# Patient Record
Sex: Male | Born: 1945 | Race: White | Hispanic: No | Marital: Married | Smoking: Never smoker
Health system: Southern US, Community
[De-identification: ages and names within clinical notes are randomized; demographics above are authoritative.]

## PROBLEM LIST (undated history)

## (undated) DIAGNOSIS — I219 Acute myocardial infarction, unspecified: Secondary | ICD-10-CM

## (undated) DIAGNOSIS — E119 Type 2 diabetes mellitus without complications: Secondary | ICD-10-CM

## (undated) DIAGNOSIS — I639 Cerebral infarction, unspecified: Secondary | ICD-10-CM

## (undated) DIAGNOSIS — I2510 Atherosclerotic heart disease of native coronary artery without angina pectoris: Secondary | ICD-10-CM

## (undated) DIAGNOSIS — I10 Essential (primary) hypertension: Secondary | ICD-10-CM

## (undated) DIAGNOSIS — G20 Parkinson's disease: Secondary | ICD-10-CM

## (undated) DIAGNOSIS — I82409 Acute embolism and thrombosis of unspecified deep veins of unspecified lower extremity: Secondary | ICD-10-CM

## (undated) HISTORY — PX: CORONARY ANGIOPLASTY WITH STENT PLACEMENT: SHX49

---

## 2004-04-28 ENCOUNTER — Emergency Department (HOSPITAL_COMMUNITY): Admission: EM | Admit: 2004-04-28 | Discharge: 2004-04-28 | Payer: Self-pay | Admitting: Emergency Medicine

## 2007-04-23 HISTORY — PX: FINGER SURGERY: SHX640

## 2007-11-21 ENCOUNTER — Emergency Department (HOSPITAL_BASED_OUTPATIENT_CLINIC_OR_DEPARTMENT_OTHER): Admission: EM | Admit: 2007-11-21 | Discharge: 2007-11-21 | Payer: Self-pay | Admitting: Emergency Medicine

## 2008-03-24 ENCOUNTER — Ambulatory Visit: Payer: Self-pay | Admitting: Diagnostic Radiology

## 2008-03-24 ENCOUNTER — Emergency Department (HOSPITAL_BASED_OUTPATIENT_CLINIC_OR_DEPARTMENT_OTHER): Admission: EM | Admit: 2008-03-24 | Discharge: 2008-03-24 | Payer: Self-pay | Admitting: Emergency Medicine

## 2009-04-22 HISTORY — PX: ULNAR NERVE TRANSPOSITION: SHX2595

## 2009-10-01 ENCOUNTER — Ambulatory Visit: Payer: Self-pay | Admitting: Diagnostic Radiology

## 2009-10-01 ENCOUNTER — Emergency Department (HOSPITAL_BASED_OUTPATIENT_CLINIC_OR_DEPARTMENT_OTHER): Admission: EM | Admit: 2009-10-01 | Discharge: 2009-10-01 | Payer: Self-pay | Admitting: Emergency Medicine

## 2010-09-07 NOTE — Consult Note (Signed)
NAMEBRADLY, [name] NO.:  [id number]   MEDICAL RECORD NO.:  [id number]          PATIENT TYPE:  EMS   LOCATION:  ED                           FACILITY:  [facility]   PHYSICIAN:  Erasmo Leventhal, M.D.DATE OF BIRTH:  [birth year]   DATE OF CONSULTATION:  04/28/2004  DATE OF DISCHARGE:                                   CONSULTATION   ORTHOPEDICS CONSULTATION/EMERGENCY ROOM VISIT:   HISTORY OF PRESENT ILLNESS:  Mr. [name] is a 65 year old male patient of  Dr. Augusto Gamble.  Patient underwent a left elbow ulnar nerve transposition  and removal of olecranon bursa approximately 4 weeks ago.  He has done fine  however, over the past 36-48 hours he has developed increasing pain and  swelling in the left elbow region, he called our office Friday during the  day and again last night.  Tonight he called me and said his left elbow was  warm, felt hot, he was having increasing pain, I asked him to come to the  emergency room to be evaluated.  He denied any known fever, chills or sweats  or drainage.  He had no constitutional symptoms.   PHYSICAL EXAMINATION:  He is awake and alert, he is very cooperative, very  pleasant gentleman.  He is in no acute distress.  Afebrile.  Left elbow is  examined, the elbow joint itself no palpable effusion, range of motion and  flexion 140, extension full, supination and pronation full, his elbow wound  is well healed, he does have a large area of fluid collection near the  surgical site.   I discussed with the patient and told him we best aspirate this for  diagnosis and treatment.  He agreed.   PROCEDURE:  Betadine prep, aspirated the area, removed approximately 50 mL  of a serous colored fluid, no evidence of pus or purulence.  This is well  decompressed and he felt much better after this.  Fluid was sent for Gram  stain and culture.   IMPRESSION:  Left elbow postoperative seroma.   RECOMMENDATIONS:  At this time we went ahead and  sent a Gram stain and  culture.  I would like to prophylactically go ahead and put him on  antibiotics.  He has no allergies.  I put him on Keflex 500 q.i.d.  He will  go home, immobilize the elbow, elevate, and I also placed him on Lodine 400  b.i.d. p.c.  If he has any increasing problems over the weekend he is going  to give [place] a call, if not he has therapy scheduled at Dr. Carlos Levering office on  Monday afternoon.  He was already told to come by the office on Friday if he  was not feeling better to come see Dr. Amanda Pea on Monday, he knows to show  the elbow to the therapist on Monday afternoon and if they think there is a  concern they will call the service of Dr. Amanda Pea.  He understands if he has  any worsening of condition tonight to give me a call.  All questions  encouraged  and answered with Mr. Katzman in great detail.  He is a very  pleasant gentleman and a pleasure to see this evening.      RAC/MEDQ  D:  04/28/2004  T:  04/28/2004  Job:  [id number]   cc:   Earlie Server. Everlene Other, M.D.  [address]  [place]  [place] [address]  Fax: [phone]

## 2011-01-25 LAB — DIFFERENTIAL
Basophils Absolute: 0 10*3/uL (ref 0.0–0.1)
Basophils Relative: 0 % (ref 0–1)
Eosinophils Absolute: 0.3 10*3/uL (ref 0.0–0.7)
Lymphocytes Relative: 19 % (ref 12–46)

## 2011-01-25 LAB — CBC
Hemoglobin: 15.7 g/dL (ref 13.0–17.0)
MCV: 91.1 fL (ref 78.0–100.0)
Platelets: 163 10*3/uL (ref 150–400)
RBC: 5.06 MIL/uL (ref 4.22–5.81)
RDW: 12.9 % (ref 11.5–15.5)
WBC: 9.3 10*3/uL (ref 4.0–10.5)

## 2011-01-25 LAB — COMPREHENSIVE METABOLIC PANEL
ALT: 25 U/L (ref 0–53)
Alkaline Phosphatase: 87 U/L (ref 39–117)
Calcium: 9.3 mg/dL (ref 8.4–10.5)
Chloride: 102 mEq/L (ref 96–112)
GFR calc Af Amer: >60 mL/min (ref >60)
GFR calc non Af Amer: >60 mL/min (ref >60)
Potassium: 3.6 mEq/L (ref 3.5–5.1)
Total Protein: 6.9 g/dL (ref 6.0–8.3)

## 2011-01-25 LAB — POCT CARDIAC MARKERS
CKMB, poc: 3.6 ng/mL (ref 1.0–8.0)
Myoglobin, poc: 116 ng/mL (ref 12–200)

## 2011-01-25 LAB — PROTIME-INR
INR: 1 (ref 0.00–1.49)
Prothrombin Time: 13.7 seconds (ref 11.6–15.2)

## 2011-04-23 HISTORY — PX: CORONARY ANGIOPLASTY WITH STENT PLACEMENT: SHX49

## 2011-11-22 ENCOUNTER — Emergency Department (HOSPITAL_BASED_OUTPATIENT_CLINIC_OR_DEPARTMENT_OTHER): Payer: Managed Care, Other (non HMO)

## 2011-11-22 ENCOUNTER — Emergency Department (HOSPITAL_BASED_OUTPATIENT_CLINIC_OR_DEPARTMENT_OTHER)
Admission: EM | Admit: 2011-11-22 | Discharge: 2011-11-22 | Disposition: A | Discharge status: Home / Self Care | Payer: Managed Care, Other (non HMO) | Attending: Emergency Medicine | Admitting: Emergency Medicine

## 2011-11-22 ENCOUNTER — Encounter (HOSPITAL_BASED_OUTPATIENT_CLINIC_OR_DEPARTMENT_OTHER): Payer: Self-pay | Admitting: Emergency Medicine

## 2011-11-22 DIAGNOSIS — E119 Type 2 diabetes mellitus without complications: Secondary | ICD-10-CM | POA: Insufficient documentation

## 2011-11-22 DIAGNOSIS — Z8673 Personal history of transient ischemic attack (TIA), and cerebral infarction without residual deficits: Secondary | ICD-10-CM | POA: Insufficient documentation

## 2011-11-22 DIAGNOSIS — M542 Cervicalgia: Secondary | ICD-10-CM

## 2011-11-22 DIAGNOSIS — I2510 Atherosclerotic heart disease of native coronary artery without angina pectoris: Secondary | ICD-10-CM | POA: Insufficient documentation

## 2011-11-22 DIAGNOSIS — M62838 Other muscle spasm: Secondary | ICD-10-CM

## 2011-11-22 DIAGNOSIS — I10 Essential (primary) hypertension: Secondary | ICD-10-CM | POA: Insufficient documentation

## 2011-11-22 HISTORY — DX: Atherosclerotic heart disease of native coronary artery without angina pectoris: I25.10

## 2011-11-22 HISTORY — DX: Essential (primary) hypertension: I10

## 2011-11-22 HISTORY — DX: Cerebral infarction, unspecified: I63.9

## 2011-11-22 MED ORDER — OXYCODONE-ACETAMINOPHEN 5-325 MG PO TABS: ORAL_TABLET | ORAL | Status: DC

## 2011-11-22 MED ORDER — DIAZEPAM 5 MG PO TABS: ORAL_TABLET | ORAL | Status: DC

## 2011-11-22 NOTE — ED Provider Notes (Signed)
History     CSN: [id number]  Arrival date & time 11/22/11  0239   First MD Initiated Contact with Patient 11/22/11 0256      Chief Complaint  Patient presents with  . Neck Injury    (Consider location/radiation/quality/duration/timing/severity/associated sxs/prior treatment) HPI Patient is a 66 year old male who presents today for neck pain he rates as a 10 out of 10 and sharp. Patient developed significant discomfort 3 days ago upon waking. Patient feels he has difficulty with turning his neck to the right. He has tried a dose of his wife's muscle relaxer at home. He did not know the name of this but knows that it is not Valium. He's also tried hydrocodone left from a previous surgery without relief. Patient played golf a few days ago but cannot think of a specific injury. He does know that he's had increasing pain in his neck over time and wondered if he could have bone spurs in his neck. He has no history of osteoporosis or conditions predisposing to pathologic fractures such as increased steroid use or malignancy. Patient denies any numbness, tingling, weakness, or incontinence. There are no other associated or modifying factors. Pain is worse with palpation along the right trapezius as well as movement to the right. Past Medical History  Diagnosis Date  . Hypertension   . Diabetes mellitus   . Coronary artery disease   . Stroke     Past Surgical History  Procedure Date  . Stents to heart     History reviewed. No pertinent family history.  History  Substance Use Topics  . Smoking status: Never Smoker   . Smokeless tobacco: Not on file  . Alcohol Use: 2.0 oz/week    4 drink(s) per week      Review of Systems  Constitutional: Negative.   HENT: Positive for neck pain and neck stiffness.   Eyes: Negative.   Respiratory: Negative.   Cardiovascular: Negative.   Gastrointestinal: Negative.   Genitourinary: Negative.   Skin: Negative.   Neurological: Negative.     Hematological: Negative.   Psychiatric/Behavioral: Negative.   All other systems reviewed and are negative.    Allergies  Lisinopril  Home Medications   Current Outpatient Rx  Name Route Sig Dispense Refill  . ASPIRIN 81 MG PO TABS Oral Take 81 mg by mouth daily.    Marland Kitchen HYDROCODONE-ACETAMINOPHEN 5-500 MG PO TABS Oral Take 1 tablet by mouth every 6 (six) hours as needed.    Marland Kitchen LOSARTAN POTASSIUM 50 MG PO TABS Oral Take 50 mg by mouth daily.    Marland Kitchen METHOCARBAMOL 500 MG PO TABS Oral Take 500 mg by mouth 4 (four) times daily.    Marland Kitchen OMEPRAZOLE 10 MG PO CPDR Oral Take 10 mg by mouth daily.    Marland Kitchen ROSUVASTATIN CALCIUM 10 MG PO TABS Oral Take 10 mg by mouth daily.    . WARFARIN SODIUM 5 MG PO TABS Oral Take 5 mg by mouth daily.    Marland Kitchen DIAZEPAM 5 MG PO TABS  Take 1 tab by mouth when necessary muscle spasm. You may repeat x1 at 30 minutes if no improvement. Do not take more than 2 pills every 8 hours. 5 tablet 0  . OXYCODONE-ACETAMINOPHEN 5-325 MG PO TABS  Take 1-2 pills by mouth every 6 hours when necessary pain. 15 tablet 0    BP 121/92  Pulse 88  Temp 97.8 F (36.6 C) (Oral)  Resp 18  SpO2 97%  Physical Exam  Nursing note and  vitals reviewed. GEN: Well-developed, well-nourished male in no distress HEENT: Atraumatic, normocephalic. No midline tenderness to palpation. Patient with point tenderness in the right trapezius. Limited range of motion with rotating of the head to the right secondary to pain. EYES: PERRLA BL, no scleral icterus. NECK: Trachea midline, no meningismus CV: regular rate and rhythm. No murmurs, rubs, or gallops PULM: No respiratory distress.  No crackles, wheezes, or rales. GI: soft, non-tender. No guarding, rebound, or tenderness. + bowel sounds  GU: deferred Neuro: cranial nerves grossly 2-12 intact, no abnormalities of strength or sensation, A and O x 3 MSK: Patient moves all 4 extremities symmetrically, no deformity, edema, or injury noted Skin: No rashes petechiae,  purpura, or jaundice Psych: no abnormality of mood   ED Course  Procedures (including critical care time)  Labs Reviewed - No data to display Dg Cervical Spine Complete  11/22/2011  *RADIOLOGY REPORT*  Clinical Data: Severe neck pain  CERVICAL SPINE - COMPLETE 4+ VIEW  Comparison: None.  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  Minimal anterolisthesis of C2 on C3 and mild anterolisthesis of C7 on T1.  No acute fracture or dislocation identified.  Lung apices are clear.  Maintained C1-2 articulation. No dens fracture.  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  Minimal anterolisthesis of C2 on C3 and mild anterolisthesis of C7 on T1.  No acute osseous finding.  Original Report Authenticated By: Waneta Martins, M.D.     1. Neck pain on right side   2. Muscle spasms of neck       MDM  Based on my evaluation patient had plain film of the C-spine. This did show degenerative changes but no fractures or signs of pathology predisposing to this. Patient did not have a ride home this evening. We discussed that I felt that his acute 3 days this pain was likely secondary to torticollis. He was given a prescription for Valium 5 mg tabs. Patient was instructed to take one dose and wait for improvement. If he felt no effects at 30 minutes patient was to take a second dose. Patient also was provided a prescription for several tabs of Percocet to take if he continued to have pain. He was advised to take these instead of Vicodin. Patient was notified of fall risk with these medications. He was also told that given his x-ray findings it did appear that he might have some ongoing problem he also is advised to followup with his primary care provider. Patient was discharged in good condition.s with neck pain in the future. He was given the name of the neurosurgeon on call to followup as needed.        Cyndra Numbers, MD 11/22/11 [phone]

## 2011-11-22 NOTE — ED Notes (Signed)
Pt reports neck pain to right posterior neck x 3 days

## 2011-11-22 NOTE — ED Notes (Signed)
Pt reports severe throbbing like pain to right posterior neck x 3 days, nothing makes it better, originally he thought maybe he slept wrong, but pain has continued. Pt self medicated at home with hydrocodone and robaxin without relief, denies numbness and parasthesia, decreased rom, no radiation

## 2012-09-20 ENCOUNTER — Encounter (HOSPITAL_BASED_OUTPATIENT_CLINIC_OR_DEPARTMENT_OTHER): Payer: Self-pay

## 2012-09-20 ENCOUNTER — Emergency Department (HOSPITAL_BASED_OUTPATIENT_CLINIC_OR_DEPARTMENT_OTHER)
Admission: EM | Admit: 2012-09-20 | Discharge: 2012-09-20 | Disposition: A | Discharge status: Home / Self Care | Payer: Medicare Other | Attending: Emergency Medicine | Admitting: Emergency Medicine

## 2012-09-20 DIAGNOSIS — Z8673 Personal history of transient ischemic attack (TIA), and cerebral infarction without residual deficits: Secondary | ICD-10-CM | POA: Insufficient documentation

## 2012-09-20 DIAGNOSIS — I10 Essential (primary) hypertension: Secondary | ICD-10-CM | POA: Insufficient documentation

## 2012-09-20 DIAGNOSIS — Z7982 Long term (current) use of aspirin: Secondary | ICD-10-CM | POA: Insufficient documentation

## 2012-09-20 DIAGNOSIS — I2510 Atherosclerotic heart disease of native coronary artery without angina pectoris: Secondary | ICD-10-CM | POA: Insufficient documentation

## 2012-09-20 DIAGNOSIS — S61002A Unspecified open wound of left thumb without damage to nail, initial encounter: Secondary | ICD-10-CM

## 2012-09-20 DIAGNOSIS — Z79899 Other long term (current) drug therapy: Secondary | ICD-10-CM | POA: Insufficient documentation

## 2012-09-20 DIAGNOSIS — Y9389 Activity, other specified: Secondary | ICD-10-CM | POA: Insufficient documentation

## 2012-09-20 DIAGNOSIS — Z9861 Coronary angioplasty status: Secondary | ICD-10-CM | POA: Insufficient documentation

## 2012-09-20 DIAGNOSIS — E119 Type 2 diabetes mellitus without complications: Secondary | ICD-10-CM | POA: Insufficient documentation

## 2012-09-20 DIAGNOSIS — S61209A Unspecified open wound of unspecified finger without damage to nail, initial encounter: Secondary | ICD-10-CM | POA: Insufficient documentation

## 2012-09-20 DIAGNOSIS — Z7901 Long term (current) use of anticoagulants: Secondary | ICD-10-CM | POA: Insufficient documentation

## 2012-09-20 DIAGNOSIS — W278XXA Contact with other nonpowered hand tool, initial encounter: Secondary | ICD-10-CM | POA: Insufficient documentation

## 2012-09-20 DIAGNOSIS — Y929 Unspecified place or not applicable: Secondary | ICD-10-CM | POA: Insufficient documentation

## 2012-09-20 NOTE — ED Notes (Signed)
Pt states that he has laceration to the L thumb from a table saw.  Pt states that he is on coumadin, bleeding will not stop.  Pt has bleeding under control at present with gauze pressure dressing.  Last Tetanus <5 yr ago.

## 2012-09-20 NOTE — ED Provider Notes (Signed)
History     CSN: [id number]  Arrival date & time 09/20/12  1033   First MD Initiated Contact with Patient 09/20/12 1059      Chief Complaint  Patient presents with  . Laceration    (Consider location/radiation/quality/duration/timing/severity/associated sxs/prior treatment) HPI Comments: States that when he takes off the bandage it keeps bleeding  Patient is a 67 y.o. male presenting with skin laceration. The history is provided by the patient.  Laceration Location:  Finger Finger laceration location:  L thumb Depth:  Cutaneous Quality: avulsion   Bleeding comment:  States has been bleeding since yesterday Time since incident:  1 day Injury mechanism: cut on a table saw. Pain details:    Quality:  Aching   Severity:  Mild   Timing:  Constant   Progression:  Unchanged Foreign body present:  No foreign bodies Relieved by:  Pressure Tetanus status:  Up to date   Past Medical History  Diagnosis Date  . Hypertension   . Diabetes mellitus   . Coronary artery disease   . Stroke     Past Surgical History  Procedure Laterality Date  . Stents to heart      History reviewed. No pertinent family history.  History  Substance Use Topics  . Smoking status: Never Smoker   . Smokeless tobacco: Never Used  . Alcohol Use: 2.0 oz/week    4 drink(s) per week      Review of Systems  All other systems reviewed and are negative.    Allergies  Lisinopril  Home Medications   Current Outpatient Rx  Name  Route  Sig  Dispense  Refill  . acetaminophen (TYLENOL) 500 MG tablet   Oral   Take 1,000 mg by mouth every 6 (six) hours as needed for pain.         Marland Kitchen aspirin 81 MG tablet   Oral   Take 81 mg by mouth daily.         Marland Kitchen HYDROcodone-acetaminophen (VICODIN) 5-500 MG per tablet   Oral   Take 1 tablet by mouth every 6 (six) hours as needed.         Marland Kitchen ibuprofen (ADVIL,MOTRIN) 200 MG tablet   Oral   Take 800 mg by mouth every 6 (six) hours as needed for  pain.         Marland Kitchen omeprazole (PRILOSEC) 20 MG capsule   Oral   Take 40 mg by mouth daily.         Marland Kitchen oxyCODONE-acetaminophen (PERCOCET/ROXICET) 5-325 MG per tablet      Take 1-2 pills by mouth every 6 hours when necessary pain.   15 tablet   0   . rosuvastatin (CRESTOR) 10 MG tablet   Oral   Take 10 mg by mouth daily.         . valsartan (DIOVAN) 40 MG tablet   Oral   Take 40 mg by mouth daily.         Marland Kitchen warfarin (COUMADIN) 5 MG tablet   Oral   Take 5 mg by mouth daily. Take 5 mg every day except on Monday and Friday take 7.5mg .         . diazepam (VALIUM) 5 MG tablet      Take 1 tab by mouth when necessary muscle spasm. You may repeat x1 at 30 minutes if no improvement. Do not take more than 2 pills every 8 hours.   5 tablet   0   . losartan (COZAAR) 50  MG tablet   Oral   Take 50 mg by mouth daily.         . methocarbamol (ROBAXIN) 500 MG tablet   Oral   Take 500 mg by mouth 4 (four) times daily.         Marland Kitchen omeprazole (PRILOSEC) 10 MG capsule   Oral   Take 20 mg by mouth daily.            BP 107/92  Pulse 89  Temp(Src) 98.7 F (37.1 C) (Oral)  Resp 20  Ht 5' 8.5" (1.74 m)  Wt 210 lb (95.255 kg)  BMI 31.46 kg/m2  SpO2 95%  Physical Exam  Nursing note and vitals reviewed. Constitutional: He is oriented to person, place, and time. He appears well-developed and well-nourished. No distress.  HENT:  Head: Normocephalic and atraumatic.  Cardiovascular: Normal rate.   Pulmonary/Chest: Effort normal.  Musculoskeletal:       Hands: Neurological: He is alert and oriented to person, place, and time.  Skin: Skin is warm and dry.  Psychiatric: He has a normal mood and affect. His behavior is normal.    ED Course  Procedures (including critical care time)  Labs Reviewed - No data to display No results found.   1. Avulsion of skin of thumb without complication, left, initial encounter       MDM   Patient with an injury to his thumb  yesterday on a table saw where he avulsed the skin. He is on Coumadin and states that he had continued bleeding however with removal of the bandage now there is no active bleeding. There is nothing to be sutured at this time and no sign of foreign body. Patient's tetanus is up to date. We'll dress the wound and instructed patient to use bacitracin and wash with soap and water.        Gwyneth Sprout, MD 09/20/12 1327

## 2013-05-02 ENCOUNTER — Emergency Department (HOSPITAL_BASED_OUTPATIENT_CLINIC_OR_DEPARTMENT_OTHER)
Admission: EM | Admit: 2013-05-02 | Discharge: 2013-05-02 | Disposition: A | Discharge status: Home / Self Care | Payer: Medicare Other | Attending: Emergency Medicine | Admitting: Emergency Medicine

## 2013-05-02 ENCOUNTER — Ambulatory Visit (HOSPITAL_COMMUNITY): Payer: Medicare Other | Attending: Emergency Medicine

## 2013-05-02 ENCOUNTER — Encounter (HOSPITAL_BASED_OUTPATIENT_CLINIC_OR_DEPARTMENT_OTHER): Payer: Self-pay | Admitting: Emergency Medicine

## 2013-05-02 DIAGNOSIS — I219 Acute myocardial infarction, unspecified: Secondary | ICD-10-CM | POA: Diagnosis not present

## 2013-05-02 DIAGNOSIS — I82409 Acute embolism and thrombosis of unspecified deep veins of unspecified lower extremity: Secondary | ICD-10-CM | POA: Insufficient documentation

## 2013-05-02 DIAGNOSIS — M79609 Pain in unspecified limb: Secondary | ICD-10-CM | POA: Insufficient documentation

## 2013-05-02 DIAGNOSIS — I10 Essential (primary) hypertension: Secondary | ICD-10-CM | POA: Insufficient documentation

## 2013-05-02 DIAGNOSIS — I2510 Atherosclerotic heart disease of native coronary artery without angina pectoris: Secondary | ICD-10-CM | POA: Insufficient documentation

## 2013-05-02 DIAGNOSIS — E119 Type 2 diabetes mellitus without complications: Secondary | ICD-10-CM | POA: Insufficient documentation

## 2013-05-02 DIAGNOSIS — I6350 Cerebral infarction due to unspecified occlusion or stenosis of unspecified cerebral artery: Secondary | ICD-10-CM | POA: Diagnosis not present

## 2013-05-02 DIAGNOSIS — M79605 Pain in left leg: Secondary | ICD-10-CM

## 2013-05-02 DIAGNOSIS — I82402 Acute embolism and thrombosis of unspecified deep veins of left lower extremity: Secondary | ICD-10-CM

## 2013-05-02 HISTORY — DX: Acute myocardial infarction, unspecified: I21.9

## 2013-05-02 HISTORY — DX: Acute embolism and thrombosis of unspecified deep veins of unspecified lower extremity: I82.409

## 2013-05-02 LAB — BASIC METABOLIC PANEL
BUN: 31 mg/dL — ABNORMAL HIGH (ref 6–23)
CO2: 28 meq/L (ref 19–32)
Calcium: 9.3 mg/dL (ref 8.4–10.5)
Chloride: 100 mEq/L (ref 96–112)
Creatinine, Ser: 1 mg/dL (ref 0.50–1.35)
GFR calc Af Amer: 88 mL/min — ABNORMAL LOW (ref >90)
GFR, EST NON AFRICAN AMERICAN: 76 mL/min — AB (ref >90)
Glucose, Bld: 102 mg/dL — ABNORMAL HIGH (ref 70–99)
Potassium: 4.1 mEq/L (ref 3.7–5.3)
Sodium: 139 mEq/L (ref 137–147)

## 2013-05-02 LAB — CBC WITH DIFFERENTIAL/PLATELET
BASOS ABS: 0 10*3/uL (ref 0.0–0.1)
BASOS PCT: 0 % (ref 0–1)
EOS PCT: 5 % (ref 0–5)
Eosinophils Absolute: 0.3 10*3/uL (ref 0.0–0.7)
HCT: 44 % (ref 39.0–52.0)
HEMOGLOBIN: 15.1 g/dL (ref 13.0–17.0)
LYMPHS ABS: 1.8 10*3/uL (ref 0.7–4.0)
LYMPHS PCT: 25 % (ref 12–46)
MCH: 31.2 pg (ref 26.0–34.0)
MCHC: 34.3 g/dL (ref 30.0–36.0)
MCV: 90.9 fL (ref 78.0–100.0)
Monocytes Absolute: 0.8 10*3/uL (ref 0.1–1.0)
Monocytes Relative: 11 % (ref 3–12)
NEUTROS ABS: 4.2 10*3/uL (ref 1.7–7.7)
NEUTROS PCT: 59 % (ref 43–77)
PLATELETS: 175 10*3/uL (ref 150–400)
RBC: 4.84 MIL/uL (ref 4.22–5.81)
RDW: 13.5 % (ref 11.5–15.5)
WBC: 7.1 10*3/uL (ref 4.0–10.5)

## 2013-05-02 LAB — PROTIME-INR
INR: 2.03 — ABNORMAL HIGH (ref 0.00–1.49)
Prothrombin Time: 22.3 seconds — ABNORMAL HIGH (ref 11.6–15.2)

## 2013-05-02 MED ORDER — RIVAROXABAN 20 MG PO TABS: 20.0000 mg | ORAL_TABLET | Freq: Every day | ORAL | Status: DC

## 2013-05-02 MED ORDER — ENOXAPARIN SODIUM 100 MG/ML ~~LOC~~ SOLN
1.0000 mg/kg | Freq: Once | SUBCUTANEOUS | Status: AC
  Administered 2013-05-02: 95 mg via SUBCUTANEOUS
  Filled 2013-05-02: qty 1

## 2013-05-02 MED ORDER — RIVAROXABAN 15 MG PO TABS
15.0000 mg | ORAL_TABLET | Freq: Two times a day (BID) | ORAL | Status: DC
Start: 2013-05-02 — End: 2013-05-02
  Filled 2013-05-02 (×2): qty 1

## 2013-05-02 NOTE — Progress Notes (Addendum)
ANTICOAGULATION CONSULT NOTE - Initial Consult  Pharmacy Consult for xarelto Indication: DVT  Allergies  Allergen Reactions  . Lisinopril Cough    Vital Signs: Temp: 98.2 F (36.8 C) (01/11 2021) Temp src: Oral (01/11 2021) BP: 135/96 mmHg (01/11 2030) Pulse Rate: 87 (01/11 2030)  Labs:  Recent Labs  05/02/13 1645  HGB 15.1  HCT 44.0  PLT 175  LABPROT 22.3*  INR 2.03*  CREATININE 1.00    The CrCl is unknown because both a height and weight (above a minimum accepted value) are required for this calculation.   Medical History: Past Medical History  Diagnosis Date  . Hypertension   . Diabetes mellitus   . Coronary artery disease   . Stroke   . DVT (deep venous thrombosis)   . Myocardial infarction      Assessment: 68 yo with history of stroke and DVT on coumadin PTA. Patient now admitted with a new DVT and current INR is 2.03. Pharmacy has been asked to begin Xarelto.  Goal of Therapy:  Monitor platelets by anticoagulation protocol: Yes   Plan:  -Xarelto 15mg  po bid until 05/24/13 then begin 20mg  po daily with dinner -Discontinue coumadin -Will provide patient education  Harland German, Pharm D 05/02/2013 8:51 PM   Addendum: Plan is to d/c initiation of Xarelto and start therapeutic lovenox for DVT.   Plan: 1) Give lovenox 95 mg subq twice daily.  2) Monitor daily CBC, and s/s of bleeding 3) F/u long-term anticoagulation plans  Vinnie Level, PharmD.  Clinical Pharmacist Pager [phone]

## 2013-05-02 NOTE — ED Provider Notes (Signed)
CSN: [id number]     Arrival date & time 05/02/13  1421 History   First MD Initiated Contact with Patient 05/02/13 1628     Chief Complaint  Patient presents with  . Leg Pain   (Consider location/radiation/quality/duration/timing/severity/associated sxs/prior Treatment) HPI 68 year old male history of DVT about 10 years ago history of TIAs for which he is on chronic Coumadin therapy recently traveled across the country and returned home a couple weeks ago now presents with less than 24 hours of gradual onset mild to moderate left calf pain without redness to his skin without weakness or numbness without color change to his foot without thigh pain without lightheadedness without syncope without chest pain or shortness of breath without fever without trauma. There is no treatment prior to arrival. He has no radiation or associated symptoms. Past Medical History  Diagnosis Date  . Hypertension   . Diabetes mellitus   . Coronary artery disease   . Stroke   . DVT (deep venous thrombosis)   . Myocardial infarction    Past Surgical History  Procedure Laterality Date  . Stents to heart     No family history on file. History  Substance Use Topics  . Smoking status: Never Smoker   . Smokeless tobacco: Never Used  . Alcohol Use: 2.0 oz/week    4 drink(s) per week    Review of Systems 10 Systems reviewed and are negative for acute change except as noted in the HPI. Allergies  Lisinopril  Home Medications   Current Outpatient Rx  Name  Route  Sig  Dispense  Refill  . acetaminophen (TYLENOL) 500 MG tablet   Oral   Take 1,000 mg by mouth every 6 (six) hours as needed for pain.         Marland Kitchen. aspirin 81 MG tablet   Oral   Take 81 mg by mouth daily.         Marland Kitchen. ibuprofen (ADVIL,MOTRIN) 200 MG tablet   Oral   Take 800 mg by mouth every 6 (six) hours as needed for pain.         Marland Kitchen. losartan (COZAAR) 50 MG tablet   Oral   Take 50 mg by mouth daily.         Marland Kitchen. omeprazole (PRILOSEC)  20 MG capsule   Oral   Take 40 mg by mouth daily.         . rosuvastatin (CRESTOR) 10 MG tablet   Oral   Take 10 mg by mouth daily.         . valsartan (DIOVAN) 40 MG tablet   Oral   Take 40 mg by mouth daily.         Marland Kitchen. warfarin (COUMADIN) 5 MG tablet   Oral   Take 5 mg by mouth daily. Take 5 mg every day except on Monday Wednesday and Friday take 7.5mg .          BP 140/98  Pulse 90  Temp(Src) 98.1 F (36.7 C) (Oral)  Resp 18  SpO2 98% Physical Exam  Nursing note and vitals reviewed. Constitutional:  Awake, alert, nontoxic appearance.  HENT:  Head: Atraumatic.  Eyes: Right eye exhibits no discharge. Left eye exhibits no discharge.  Neck: Neck supple.  Cardiovascular: Normal rate and regular rhythm.   No murmur heard. Pulmonary/Chest: Effort normal and breath sounds normal. No respiratory distress. He has no wheezes. He has no rales. He exhibits no tenderness.  Abdominal: Soft. There is no tenderness. There is no rebound.  Musculoskeletal: He exhibits tenderness. He exhibits no edema.  Baseline ROM, no obvious new focal weakness. Left leg has no thigh tenderness no asymmetric swelling of his lower legs no edema to the left leg he does however have some very mild left lower calf region tenderness without overlying erythema or induration no rash to the skin his left foot has normal light touch good range of motion DP pulse intact capillary refill less than 2 seconds good foot dorsiflexion plantar flexion.  Neurological: He is alert.  Mental status and motor strength appears baseline for patient and situation.  Skin: No rash noted.  Psychiatric: He has a normal mood and affect.    ED Course  Procedures (including critical care time) Wells low risk DVT; Pt desires to drive himself to [facility] ED for imaging; feel transfer by POV reasonable; d/w Dr. Denton Lank and [facility] ED charge RN as well as Hilbert Bible who is expecting patient. 1700 Labs pending can be checked at [facility] ED. Labs  Review Labs Reviewed  CBC WITH DIFFERENTIAL  BASIC METABOLIC PANEL  PROTIME-INR   Imaging Review No results found.  EKG Interpretation   None       MDM   1. Left leg pain    The patient appears reasonably stabilized for transfer considering the current resources, flow, and capabilities available in the ED at this time, and I doubt any other [facility] requiring further screening and/or treatment in the ED prior to transfer.    Hurman Horn, MD 05/02/13 [phone]

## 2013-05-02 NOTE — Progress Notes (Signed)
VASCULAR LAB PRELIMINARY  PRELIMINARY  PRELIMINARY  PRELIMINARY  Left lower extremity venous Doppler completed.    Preliminary report:  There is acute DVT noted in the left peroneal vein.  Savino Whisenant, RVT 05/02/2013, 6:19 PM

## 2013-05-02 NOTE — ED Notes (Signed)
MD at bedside. 

## 2013-05-02 NOTE — ED Notes (Signed)
Pt states he has left calf pain since yesterday.  Pain seems to worsen.  Pt states he has been sitting at the computer x one week.

## 2013-05-02 NOTE — Discharge Instructions (Signed)
You have been diagnosed with a DVT.  Please see your regular doctor tomorrow.   Deep Vein Thrombosis A deep vein thrombosis (DVT) is a blood clot that develops in the deep, larger veins of the leg, arm, or pelvis. These are more dangerous than clots that might form in veins near the surface of the body. A DVT can lead to complications if the clot breaks off and travels in the bloodstream to the lungs.  A DVT can damage the valves in your leg veins, so that instead of flowing upward, the blood pools in the lower leg. This is called post-thrombotic syndrome, and it can result in pain, swelling, discoloration, and sores on the leg. CAUSES Usually, several things contribute to blood clots forming. Contributing factors include:  The flow of blood slows down.  The inside of the vein is damaged in some way.  You have a condition that makes blood clot more easily. RISK FACTORS Some people are more likely than others to develop blood clots. Risk factors include:   Older age, especially over 50 years of age.  Having a family history of blood clots or if you have already had a blot clot.  Having major or lengthy surgery. This is especially true for surgery on the hip, knee, or belly (abdomen). Hip surgery is particularly high risk.  Breaking a hip or leg.  Sitting or lying still for a long time. This includes long-distance travel, paralysis, or recovery from an illness or surgery.  Having cancer or cancer treatment.  Having a long, thin tube (catheter) placed inside a vein during a medical procedure.  Being overweight (obese).  Pregnancy and childbirth.  Hormone changes make the blood clot more easily during pregnancy.  The fetus puts pressure on the veins of the pelvis.  There is a risk of injury to veins during delivery or a caesarean. The risk is highest just after childbirth.  Medicines with the male hormone estrogen. This includes birth control pills and hormone replacement  therapy.  Smoking.  Other circulation or heart problems.  SIGNS AND SYMPTOMS When a clot forms, it can either partially or totally block the blood flow in that vein. Symptoms of a DVT can include:  Swelling of the leg or arm, especially if one side is much worse.  Warmth and redness of the leg or arm, especially if one side is much worse.  Pain in an arm or leg. If the clot is in the leg, symptoms may be more noticeable or worse when standing or walking. The symptoms of a DVT that has traveled to the lungs (pulmonary embolism, PE) usually start suddenly and include:  Shortness of breath.  Coughing.  Coughing up blood or blood-tinged phlegm.  Chest pain. The chest pain is often worse with deep breaths.  Rapid heartbeat. Anyone with these symptoms should get emergency medical treatment right away. Call your local emergency services (911 in the [place] if you have these symptoms. DIAGNOSIS If a DVT is suspected, your health care provider will take a full medical history and perform a physical exam. Tests that also may be required include:  Blood tests, including studies of the clotting properties of the blood.  Ultrasonography to see if you have clots in your legs or lungs.  X-rays to show the flow of blood when dye is injected into the veins (venography).  Studies of your lungs if you have any chest symptoms. PREVENTION  Exercise the legs regularly. Take a brisk 30-minute walk every day.  Maintain a weight that is appropriate for your height.  Avoid sitting or lying in bed for long periods of time without moving your legs.  Women, particularly those over the age of 35 years, should consider the risks and benefits of taking estrogen medicines, including birth control pills.  Do not smoke, especially if you take estrogen medicines.  Long-distance travel can increase your risk of DVT. You should exercise your legs by walking or pumping the muscles every hour.  In-hospital  prevention:  Many of the risk factors above relate to situations that exist with hospitalization, either for illness, injury, or elective surgery.  Your health care provider will assess you for the need for venous thromboembolism prophylaxis when you are admitted to the hospital. If you are having surgery, your surgeon will assess you the day of or day after surgery.  Prevention may include medical and nonmedical measures. TREATMENT Once identified, a DVT can be treated. It can also be prevented in some circumstances. Once you have had a DVT, you may be at increased risk for a DVT in the future. The most common treatment for DVT is blood thinning (anticoagulant) medicine, which reduces the blood's tendency to clot. Anticoagulants can stop new blood clots from forming and stop old ones from growing. They cannot dissolve existing clots. Your body does this by itself over time. Anticoagulants can be given by mouth, by IV access, or by injection. Your health care provider will determine the best program for you. Other medicines or treatments that may be used are:  Heparin or related medicines (low molecular weight heparin) are usually the first treatment for a blood clot. They act quickly. However, they cannot be taken orally.  Heparin can cause a fall in a component of blood that stops bleeding and forms blood clots (platelets). You will be monitored with blood tests to be sure this does not occur.  Warfarin is an anticoagulant that can be swallowed. It takes a few days to start working, so usually heparin or related medicines are used in combination. Once warfarin is working, heparin is usually stopped.  Less commonly, clot dissolving drugs (thrombolytics) are used to dissolve a DVT. They carry a high risk of bleeding, so they are used mainly in severe cases, where your life or a limb is threatened.  Very rarely, a blood clot in the leg needs to be removed surgically.  If you are unable to take  anticoagulants, your health care provider may arrange for you to have a filter placed in a main vein in your abdomen. This filter prevents clots from traveling to your lungs. HOME CARE INSTRUCTIONS  Take all medicines prescribed by your health care provider. Only take over-the-counter or prescription medicines for pain, fever, or discomfort as directed by your health care provider.  Warfarin. Most people will continue taking warfarin after hospital discharge. Your health care provider will advise you on the length of treatment (usually 3 6 months, sometimes lifelong).  Too much and too little warfarin are both dangerous. Too much warfarin increases the risk of bleeding. Too little warfarin continues to allow the risk for blood clots. While taking warfarin, you will need to have regular blood tests to measure your blood clotting time. These blood tests usually include both the prothrombin time (PT) and international normalized ratio (INR) tests. The PT and INR results allow your health care provider to adjust your dose of warfarin. The dose can change for many reasons. It is critically important that you take warfarin  exactly as prescribed, and that you have your PT and INR levels drawn exactly as directed.  Many foods, especially foods high in vitamin K, can interfere with warfarin and affect the PT and INR results. Foods high in vitamin K include spinach, kale, broccoli, cabbage, collard and turnip greens, brussel sprouts, peas, cauliflower, seaweed, and parsley as well as beef and pork liver, green tea, and soybean oil. You should eat a consistent amount of foods high in vitamin K. Avoid major changes in your diet, or notify your health care provider before changing your diet. Arrange a visit with a dietitian to answer your questions.  Many medicines can interfere with warfarin and affect the PT and INR results. You must tell your health care provider about any and all medicines you take. This includes  all vitamins and supplements. Be especially cautious with aspirin and anti-inflammatory medicines. Ask your health care provider before taking these. Do not take or discontinue any prescribed or over-the-counter medicine except on the advice of your health care provider or pharmacist.  Warfarin can have side effects, primarily excessive bruising or bleeding. You will need to hold pressure over cuts for longer than usual. Your health care provider or pharmacist will discuss other potential side effects.  Alcohol can change the body's ability to handle warfarin. It is best to avoid alcoholic drinks or consume only very small amounts while taking warfarin. Notify your health care provider if you change your alcohol intake.  Notify your dentist or other health care providers before procedures.  Activity. Ask your health care provider how soon you can go back to normal activities. It is important to stay active to prevent blood clots. If you are on anticoagulant medicine, avoid contact sports.  Exercise. It is very important to exercise. This is especially important while traveling, sitting, or standing for long periods of time. Exercise your legs by walking or by pumping the muscles frequently. Take frequent walks.  Compression stockings. These are tight elastic stockings that apply pressure to the lower legs. This pressure can help keep the blood in the legs from clotting. You may need to wear compression stockings at home to help prevent a DVT.  Do not smoke. If you smoke, quit. Ask your health care provider for help with quitting smoking.  Learn as much as you can about DVT. Knowing more about the condition should help you keep it from coming back.  Wear a medical alert bracelet or carry a medical alert card. SEEK MEDICAL CARE IF:  You notice a rapid heartbeat.  You feel weaker or more tired than usual.  You feel faint.  You notice increased bruising.  You feel your symptoms are not  getting better in the time expected.  You believe you are having side effects of medicine. SEEK IMMEDIATE MEDICAL CARE IF:  You have chest pain.  You have trouble breathing.  You have new or increased swelling or pain in one leg.  You cough up blood.  You notice blood in vomit, in a bowel movement, or in urine. MAKE SURE YOU:  Understand these instructions.  Will watch your condition.  Will get help right away if you are not doing well or get worse. Document Released: 04/08/2005 Document Revised: 01/27/2013 Document Reviewed: 12/14/2012 [facility]Exit[facility] Patient Information 2014 [place], [place].

## 2013-05-02 NOTE — ED Notes (Signed)
Sent from [facility] for DVT study of LLE for calf pain since last night. Feels like when he had a blood clot. Ambulatory, cms intact

## 2013-05-02 NOTE — ED Notes (Signed)
Patient experiencing throbbing left calf pain, Hx blood clots, taking coumadin daily

## 2013-05-02 NOTE — ED Notes (Addendum)
Patient instructed to drive to [facility] ER and reception will page vascular/ultrasound, going in personal vehicle with wife

## 2013-05-02 NOTE — ED Provider Notes (Signed)
CSN: [id number]     Arrival date & time 05/02/13  1421 History   First MD Initiated Contact with Patient 05/02/13 1628     Chief Complaint  Patient presents with  . Leg Pain   (Consider location/radiation/quality/duration/timing/severity/associated sxs/prior Treatment) HPI Comments: Patient referred to the emergency department by Dr. Fonnie JarvisBednar for DVT ultrasound. Patient has a history of DVTs. He states that yesterday morning he noticed some pain in his left lower extremity. He states the pain worsened throughout the day today. He recently traveled cross[place]country. He denies any chest pain, shortness of breath.  The history is provided by the patient. No language interpreter was used.    Past Medical History  Diagnosis Date  . Hypertension   . Diabetes mellitus   . Coronary artery disease   . Stroke   . DVT (deep venous thrombosis)   . Myocardial infarction    Past Surgical History  Procedure Laterality Date  . Stents to heart     History reviewed. No pertinent family history. History  Substance Use Topics  . Smoking status: Never Smoker   . Smokeless tobacco: Never Used  . Alcohol Use: 2.0 oz/week    4 drink(s) per week    Review of Systems  All other systems reviewed and are negative.    Allergies  Lisinopril  Home Medications   Current Outpatient Rx  Name  Route  Sig  Dispense  Refill  . acetaminophen (TYLENOL) 500 MG tablet   Oral   Take 1,000 mg by mouth every 6 (six) hours as needed for pain.         Marland Kitchen. aspirin 81 MG tablet   Oral   Take 81 mg by mouth daily.         Marland Kitchen. ibuprofen (ADVIL,MOTRIN) 200 MG tablet   Oral   Take 800 mg by mouth every 6 (six) hours as needed for pain.         Marland Kitchen. losartan (COZAAR) 50 MG tablet   Oral   Take 50 mg by mouth daily.         Marland Kitchen. omeprazole (PRILOSEC) 20 MG capsule   Oral   Take 40 mg by mouth daily.         . rosuvastatin (CRESTOR) 10 MG tablet   Oral   Take 10 mg by mouth daily.         . valsartan  (DIOVAN) 40 MG tablet   Oral   Take 40 mg by mouth daily.         Marland Kitchen. warfarin (COUMADIN) 5 MG tablet   Oral   Take 5 mg by mouth daily. Take 5 mg every day except on Monday Wednesday and Friday take 7.5mg .          BP 114/96  Pulse 92  Temp(Src) 97.7 F (36.5 C) (Oral)  Resp 19  SpO2 97% Physical Exam  Nursing note and vitals reviewed. Constitutional: He is oriented to person, place, and time. He appears well-developed and well-nourished.  HENT:  Head: Normocephalic and atraumatic.  Right Ear: External ear normal.  Left Ear: External ear normal.  Nose: Nose normal.  Mouth/Throat: Oropharynx is clear and moist. No oropharyngeal exudate.  Eyes: Conjunctivae and EOM are normal. Pupils are equal, round, and reactive to light. Right eye exhibits no discharge. Left eye exhibits no discharge. No scleral icterus.  Neck: Normal range of motion. Neck supple. No JVD present.  Cardiovascular: Normal rate, regular rhythm, normal heart sounds and intact distal pulses.  Exam reveals no gallop and no friction rub.   No murmur heard. Pulmonary/Chest: Effort normal and breath sounds normal. No respiratory distress. He has no wheezes. He has no rales. He exhibits no tenderness.  Abdominal: Soft. Bowel sounds are normal. He exhibits no distension and no mass. There is no tenderness. There is no rebound and no guarding.  Musculoskeletal: Normal range of motion. He exhibits no edema and no tenderness.  Left calf moderately tender to palpation, no unilateral leg swelling, no erythema, or signs of cellulitis  Neurological: He is alert and oriented to person, place, and time.  CN 3-12 intact  Skin: Skin is warm and dry.  Psychiatric: He has a normal mood and affect. His behavior is normal. Judgment and thought content normal.    ED Course  Procedures (including critical care time) Labs Review Labs Reviewed  BASIC METABOLIC PANEL - Abnormal; Notable for the following:    Glucose, Bld 102 (*)     BUN 31 (*)    GFR calc non Af Amer 76 (*)    GFR calc Af Amer 88 (*)    All other components within normal limits  PROTIME-INR - Abnormal; Notable for the following:    Prothrombin Time 22.3 (*)    INR 2.03 (*)    All other components within normal limits  CBC WITH DIFFERENTIAL   Imaging Review No results found.  EKG Interpretation   None       MDM   1. Left leg pain    VASCULAR LAB  PRELIMINARY PRELIMINARY PRELIMINARY PRELIMINARY  Left lower extremity venous Doppler completed.  Preliminary report: There is acute DVT noted in the left peroneal vein.  KANADY, CANDACE, RVT  05/02/2013, 6:19 PM  Patient with DVT.   I discussed the patient with Dr. Freida Busman, who recommends talking with pharmacy about treatment options.   I spoke with Greig Castilla the pharmacist, who tells me that it would be appropriate to switch the patient to xeralto as he does not have any renal impairment and his INR is less than 3.  I discussed this with Dr. Freida Busman, who recommends asking hematology for their advice.  I spoke with Dr. Alcide Evener from hematology, who recommends giving a dose of lovenox and discharging with PCP follow-up tomorrow.  Discussed this with Dr. Freida Busman, who agrees with Dr. Annabell Howells plan.  Will treat with lovenox and discharge.  Discussed this with the patient who understands and agrees with the plan.  He is stable and ready for discharge.  Roxy Horseman, PA-C 05/02/13 2119

## 2013-05-04 NOTE — ED Provider Notes (Signed)
Medical screening examination/treatment/procedure(s) were performed by non-physician practitioner and as supervising physician I was immediately available for consultation/collaboration.  EKG Interpretation   None        Toy Baker, MD 05/04/13 1113

## 2013-07-16 DIAGNOSIS — M5030 Other cervical disc degeneration, unspecified cervical region: Secondary | ICD-10-CM

## 2013-07-16 DIAGNOSIS — M542 Cervicalgia: Secondary | ICD-10-CM

## 2013-07-16 HISTORY — DX: Other cervical disc degeneration, unspecified cervical region: M50.30

## 2013-07-16 HISTORY — DX: Cervicalgia: M54.2

## 2013-07-23 ENCOUNTER — Encounter (HOSPITAL_BASED_OUTPATIENT_CLINIC_OR_DEPARTMENT_OTHER): Payer: Self-pay | Admitting: Emergency Medicine

## 2013-07-23 ENCOUNTER — Emergency Department (HOSPITAL_BASED_OUTPATIENT_CLINIC_OR_DEPARTMENT_OTHER)
Admission: EM | Admit: 2013-07-23 | Discharge: 2013-07-23 | Disposition: A | Discharge status: Home / Self Care | Payer: Medicare Other | Attending: Emergency Medicine | Admitting: Emergency Medicine

## 2013-07-23 DIAGNOSIS — Z8673 Personal history of transient ischemic attack (TIA), and cerebral infarction without residual deficits: Secondary | ICD-10-CM | POA: Insufficient documentation

## 2013-07-23 DIAGNOSIS — Z79899 Other long term (current) drug therapy: Secondary | ICD-10-CM | POA: Insufficient documentation

## 2013-07-23 DIAGNOSIS — I10 Essential (primary) hypertension: Secondary | ICD-10-CM | POA: Insufficient documentation

## 2013-07-23 DIAGNOSIS — I2510 Atherosclerotic heart disease of native coronary artery without angina pectoris: Secondary | ICD-10-CM | POA: Insufficient documentation

## 2013-07-23 DIAGNOSIS — R109 Unspecified abdominal pain: Secondary | ICD-10-CM | POA: Insufficient documentation

## 2013-07-23 DIAGNOSIS — E119 Type 2 diabetes mellitus without complications: Secondary | ICD-10-CM | POA: Insufficient documentation

## 2013-07-23 DIAGNOSIS — Z86718 Personal history of other venous thrombosis and embolism: Secondary | ICD-10-CM | POA: Insufficient documentation

## 2013-07-23 DIAGNOSIS — Z9861 Coronary angioplasty status: Secondary | ICD-10-CM | POA: Insufficient documentation

## 2013-07-23 DIAGNOSIS — L0231 Cutaneous abscess of buttock: Secondary | ICD-10-CM | POA: Insufficient documentation

## 2013-07-23 DIAGNOSIS — Z7901 Long term (current) use of anticoagulants: Secondary | ICD-10-CM | POA: Insufficient documentation

## 2013-07-23 DIAGNOSIS — Z7982 Long term (current) use of aspirin: Secondary | ICD-10-CM | POA: Insufficient documentation

## 2013-07-23 DIAGNOSIS — L03317 Cellulitis of buttock: Secondary | ICD-10-CM

## 2013-07-23 DIAGNOSIS — I252 Old myocardial infarction: Secondary | ICD-10-CM | POA: Insufficient documentation

## 2013-07-23 MED ORDER — SULFAMETHOXAZOLE-TMP DS 800-160 MG PO TABS
1.0000 | ORAL_TABLET | Freq: Once | ORAL | Status: AC
  Administered 2013-07-23: 1 via ORAL
  Filled 2013-07-23: qty 1

## 2013-07-23 MED ORDER — SULFAMETHOXAZOLE-TRIMETHOPRIM 800-160 MG PO TABS
1.0000 | ORAL_TABLET | Freq: Two times a day (BID) | ORAL | Status: DC
Start: 2013-07-23 — End: 2016-07-31

## 2013-07-23 NOTE — ED Provider Notes (Signed)
CSN: [id number]     Arrival date & time 07/23/13  1004 History   First MD Initiated Contact with Patient 07/23/13 1110     Chief Complaint  Patient presents with  . Rash     (Consider location/radiation/quality/duration/timing/severity/associated sxs/prior Treatment) HPI 68 year old male with buttocks rash for several months being treated with antifungal medications. He states that these areas had resolved but have come back and now are painful. He states that the one on the left is particularly painful but it is tender just sitting down on. He felt like there was some increased redness. This has occurred over the past 24 hours. He has not noted any fever or spreading rash. Is not have any pain with stooling or pain in the rectum. He has not checked his blood sugars at home. He has not noted fever, chills, nausea, vomiting, abdominal pain, scrotal pain, or urinary tract infection symptoms. Past Medical History  Diagnosis Date  . Hypertension   . Diabetes mellitus   . Coronary artery disease   . Stroke   . DVT (deep venous thrombosis)   . Myocardial infarction    Past Surgical History  Procedure Laterality Date  . Stents to heart     No family history on file. History  Substance Use Topics  . Smoking status: Never Smoker   . Smokeless tobacco: Never Used  . Alcohol Use: 2.0 oz/week    4 drink(s) per week    Review of Systems  All other systems reviewed and are negative.      Allergies  Lisinopril  Home Medications   Current Outpatient Rx  Name  Route  Sig  Dispense  Refill  . METOPROLOL TARTRATE PO   Oral   Take by mouth.         . rivaroxaban (XARELTO) 10 MG TABS tablet   Oral   Take 20 mg by mouth daily.         Marland Kitchen. acetaminophen (TYLENOL) 500 MG tablet   Oral   Take 1,000 mg by mouth every 6 (six) hours as needed for pain.         Marland Kitchen. aspirin 81 MG tablet   Oral   Take 81 mg by mouth daily.         . BuPROPion HCl ER, XL, 450 MG TB24   Oral  Take 450 mg by mouth daily.         . pantoprazole (PROTONIX) 20 MG tablet   Oral   Take 20 mg by mouth daily.         . pantoprazole (PROTONIX) 40 MG tablet   Oral   Take 40 mg by mouth daily.         . Pseudoeph-Doxylamine-DM-APAP 30-6.25-15-325 MG CAPS   Oral   Take 1 capsule by mouth daily as needed (for cold).         . Pseudoephedrine-APAP-DM 11-914-7830-325-15 MG/15ML LIQD   Oral   Take 15 mLs by mouth daily as needed (for cold).         . rosuvastatin (CRESTOR) 20 MG tablet   Oral   Take 20 mg by mouth daily.         . traMADol (ULTRAM) 50 MG tablet   Oral   Take 50 mg by mouth every 6 (six) hours as needed for moderate pain.         . valsartan-hydrochlorothiazide (DIOVAN-HCT) 320-25 MG per tablet   Oral   Take 1 tablet by mouth daily.         .Marland Kitchen  warfarin (COUMADIN) 5 MG tablet   Oral   Take 5-7.5 mg by mouth daily. Take 5 mg on Sat/Sun/Tues/Thurs Takes 7.5mg  on Mon/Wed/Fri         . zolpidem (AMBIEN) 10 MG tablet   Oral   Take 10 mg by mouth at bedtime as needed for sleep.          BP 120/93  Pulse 79  Temp(Src) 97.8 F (36.6 C) (Oral)  Resp 18  Ht 5' 8.5" (1.74 m)  Wt 215 lb (97.523 kg)  BMI 32.21 kg/m2  SpO2 100% Physical Exam  Nursing note and vitals reviewed. Constitutional: He appears well-developed and well-nourished.  HENT:  Head: Normocephalic and atraumatic.  Eyes: EOM are normal. Pupils are equal, round, and reactive to light.  Neck: Normal range of motion. Neck supple.  Cardiovascular: Normal rate and regular rhythm.   Pulmonary/Chest: Effort normal.  Abdominal: Soft. Bowel sounds are normal. There is tenderness.  Genitourinary: Rectum normal.  Musculoskeletal: Normal range of motion.  Skin:     Well circumscribed lesions bilateral buttocks each approximately 2 x 2 centimeters. The lesion on the right buttock is annular and appears tender. The lesion on the left buttock has a scalloped edge and is slightly tender to  palpation    ED Course  Procedures (including critical care time) Labs Review Labs Reviewed - No data to display Imaging Review No results found.   EKG Interpretation None      MDM   Final diagnoses:  None   patient likely with fungal infection on buttocks that has been controlled well with antifungal creams but is now concerning for a secondary bacterial infection. Does not currently appear to be any evidence of abscess. He'll be treated with Bactrim and advised have close followup.    Hilario Quarry, MD 07/23/13 1134

## 2013-07-23 NOTE — Discharge Instructions (Signed)
Cellulitis °Cellulitis is an infection of the skin and the tissue under the skin. The infected area is usually red and tender. This happens most often in the arms and lower legs. °HOME CARE  °· Take your antibiotic medicine as told. Finish the medicine even if you start to feel better. °· Keep the infected arm or leg raised (elevated). °· Put a warm cloth on the area up to 4 times per day. °· Only take medicines as told by your doctor. °· Keep all doctor visits as told. °GET HELP RIGHT AWAY IF:  °· You have a fever. °· You feel very sleepy. °· You throw up (vomit) or have watery poop (diarrhea). °· You feel sick and have muscle aches and pains. °· You see red streaks on the skin coming from the infected area. °· Your red area gets bigger or turns a dark color. °· Your bone or joint under the infected area is painful after the skin heals. °· Your infection comes back in the same area or different area. °· You have a puffy (swollen) bump in the infected area. °· You have new symptoms. °MAKE SURE YOU:  °· Understand these instructions. °· Will watch your condition. °· Will get help right away if you are not doing well or get worse. °Document Released: 09/25/2007 Document Revised: 10/08/2011 Document Reviewed: 06/24/2011 °Exit[employer]® Patient Information ©2014 [employer]. ° °

## 2013-07-23 NOTE — ED Notes (Signed)
Pt reports a rash on buttocks he has had x 6 months.  Reports he has been on topical antifungal and hydrocortisone cream and the rash will get better, then worsen.  Last night rash worsened to the point he "could not sit".

## 2013-12-24 DIAGNOSIS — I82409 Acute embolism and thrombosis of unspecified deep veins of unspecified lower extremity: Secondary | ICD-10-CM

## 2013-12-24 DIAGNOSIS — E785 Hyperlipidemia, unspecified: Secondary | ICD-10-CM

## 2013-12-24 DIAGNOSIS — K219 Gastro-esophageal reflux disease without esophagitis: Secondary | ICD-10-CM

## 2013-12-24 DIAGNOSIS — Z8673 Personal history of transient ischemic attack (TIA), and cerebral infarction without residual deficits: Secondary | ICD-10-CM

## 2013-12-24 DIAGNOSIS — E119 Type 2 diabetes mellitus without complications: Secondary | ICD-10-CM

## 2013-12-24 DIAGNOSIS — I10 Essential (primary) hypertension: Secondary | ICD-10-CM

## 2013-12-24 DIAGNOSIS — I219 Acute myocardial infarction, unspecified: Secondary | ICD-10-CM | POA: Insufficient documentation

## 2013-12-24 DIAGNOSIS — Z7901 Long term (current) use of anticoagulants: Secondary | ICD-10-CM

## 2013-12-24 HISTORY — DX: Acute embolism and thrombosis of unspecified deep veins of unspecified lower extremity: I82.409

## 2013-12-24 HISTORY — DX: Essential (primary) hypertension: I10

## 2013-12-24 HISTORY — DX: Gastro-esophageal reflux disease without esophagitis: K21.9

## 2013-12-24 HISTORY — DX: Personal history of transient ischemic attack (TIA), and cerebral infarction without residual deficits: Z86.73

## 2013-12-24 HISTORY — DX: Type 2 diabetes mellitus without complications: E11.9

## 2013-12-24 HISTORY — DX: Acute myocardial infarction, unspecified: I21.9

## 2013-12-24 HISTORY — DX: Long term (current) use of anticoagulants: Z79.01

## 2013-12-24 HISTORY — DX: Hyperlipidemia, unspecified: E78.5

## 2015-07-04 DIAGNOSIS — E669 Obesity, unspecified: Secondary | ICD-10-CM

## 2015-07-04 DIAGNOSIS — G250 Essential tremor: Secondary | ICD-10-CM

## 2015-07-04 DIAGNOSIS — G4700 Insomnia, unspecified: Secondary | ICD-10-CM

## 2015-07-04 DIAGNOSIS — F3340 Major depressive disorder, recurrent, in remission, unspecified: Secondary | ICD-10-CM

## 2015-07-04 HISTORY — DX: Obesity, unspecified: E66.9

## 2015-07-04 HISTORY — DX: Major depressive disorder, recurrent, in remission, unspecified: F33.40

## 2015-07-04 HISTORY — DX: Essential tremor: G25.0

## 2015-07-04 HISTORY — DX: Insomnia, unspecified: G47.00

## 2015-07-11 DIAGNOSIS — R079 Chest pain, unspecified: Secondary | ICD-10-CM

## 2015-07-11 DIAGNOSIS — R1010 Upper abdominal pain, unspecified: Secondary | ICD-10-CM

## 2015-07-11 DIAGNOSIS — G3184 Mild cognitive impairment, so stated: Secondary | ICD-10-CM

## 2015-07-11 DIAGNOSIS — I2510 Atherosclerotic heart disease of native coronary artery without angina pectoris: Secondary | ICD-10-CM

## 2015-07-11 DIAGNOSIS — R001 Bradycardia, unspecified: Secondary | ICD-10-CM

## 2015-07-11 DIAGNOSIS — Z8719 Personal history of other diseases of the digestive system: Secondary | ICD-10-CM

## 2015-07-11 DIAGNOSIS — Z86718 Personal history of other venous thrombosis and embolism: Secondary | ICD-10-CM

## 2015-07-11 HISTORY — DX: Mild cognitive impairment of uncertain or unknown etiology: G31.84

## 2015-07-11 HISTORY — DX: Chest pain, unspecified: R07.9

## 2015-07-11 HISTORY — DX: Bradycardia, unspecified: R00.1

## 2015-07-11 HISTORY — DX: Personal history of other venous thrombosis and embolism: Z86.718

## 2015-07-11 HISTORY — DX: Upper abdominal pain, unspecified: R10.10

## 2015-07-11 HISTORY — DX: Personal history of other diseases of the digestive system: Z87.19

## 2015-07-11 HISTORY — DX: Atherosclerotic heart disease of native coronary artery without angina pectoris: I25.10

## 2015-07-17 ENCOUNTER — Emergency Department (HOSPITAL_BASED_OUTPATIENT_CLINIC_OR_DEPARTMENT_OTHER): Payer: Medicare Other

## 2015-07-17 ENCOUNTER — Emergency Department (HOSPITAL_BASED_OUTPATIENT_CLINIC_OR_DEPARTMENT_OTHER)
Admission: EM | Admit: 2015-07-17 | Discharge: 2015-07-17 | Disposition: A | Discharge status: Home / Self Care | Payer: Medicare Other | Attending: Emergency Medicine | Admitting: Emergency Medicine

## 2015-07-17 ENCOUNTER — Encounter (HOSPITAL_BASED_OUTPATIENT_CLINIC_OR_DEPARTMENT_OTHER): Payer: Self-pay | Admitting: Emergency Medicine

## 2015-07-17 DIAGNOSIS — I10 Essential (primary) hypertension: Secondary | ICD-10-CM | POA: Diagnosis not present

## 2015-07-17 DIAGNOSIS — J069 Acute upper respiratory infection, unspecified: Secondary | ICD-10-CM | POA: Diagnosis not present

## 2015-07-17 DIAGNOSIS — R079 Chest pain, unspecified: Secondary | ICD-10-CM | POA: Diagnosis present

## 2015-07-17 DIAGNOSIS — E119 Type 2 diabetes mellitus without complications: Secondary | ICD-10-CM | POA: Insufficient documentation

## 2015-07-17 DIAGNOSIS — K219 Gastro-esophageal reflux disease without esophagitis: Secondary | ICD-10-CM | POA: Insufficient documentation

## 2015-07-17 DIAGNOSIS — Z8673 Personal history of transient ischemic attack (TIA), and cerebral infarction without residual deficits: Secondary | ICD-10-CM | POA: Insufficient documentation

## 2015-07-17 DIAGNOSIS — M546 Pain in thoracic spine: Secondary | ICD-10-CM | POA: Insufficient documentation

## 2015-07-17 DIAGNOSIS — I252 Old myocardial infarction: Secondary | ICD-10-CM | POA: Diagnosis not present

## 2015-07-17 DIAGNOSIS — Z79899 Other long term (current) drug therapy: Secondary | ICD-10-CM | POA: Diagnosis not present

## 2015-07-17 DIAGNOSIS — Z7982 Long term (current) use of aspirin: Secondary | ICD-10-CM | POA: Diagnosis not present

## 2015-07-17 DIAGNOSIS — Z9861 Coronary angioplasty status: Secondary | ICD-10-CM | POA: Insufficient documentation

## 2015-07-17 DIAGNOSIS — Z86718 Personal history of other venous thrombosis and embolism: Secondary | ICD-10-CM | POA: Diagnosis not present

## 2015-07-17 DIAGNOSIS — I2510 Atherosclerotic heart disease of native coronary artery without angina pectoris: Secondary | ICD-10-CM | POA: Insufficient documentation

## 2015-07-17 LAB — COMPREHENSIVE METABOLIC PANEL
ALBUMIN: 3.8 g/dL (ref 3.5–5.0)
ALT: 22 U/L (ref 17–63)
AST: 27 U/L (ref 15–41)
Alkaline Phosphatase: 75 U/L (ref 38–126)
Anion gap: 9 (ref 5–15)
BILIRUBIN TOTAL: 0.9 mg/dL (ref 0.3–1.2)
BUN: 21 mg/dL — AB (ref 6–20)
CO2: 27 mmol/L (ref 22–32)
CREATININE: 1.05 mg/dL (ref 0.61–1.24)
Calcium: 8.4 mg/dL — ABNORMAL LOW (ref 8.9–10.3)
Chloride: 99 mmol/L — ABNORMAL LOW (ref 101–111)
GFR calc Af Amer: >60 mL/min (ref >60)
GLUCOSE: 116 mg/dL — AB (ref 65–99)
Potassium: 3.8 mmol/L (ref 3.5–5.1)
Sodium: 135 mmol/L (ref 135–145)
TOTAL PROTEIN: 6.7 g/dL (ref 6.5–8.1)

## 2015-07-17 LAB — CBC
HEMATOCRIT: 43.2 % (ref 39.0–52.0)
HEMOGLOBIN: 15.1 g/dL (ref 13.0–17.0)
MCH: 31.7 pg (ref 26.0–34.0)
MCHC: 35 g/dL (ref 30.0–36.0)
MCV: 90.8 fL (ref 78.0–100.0)
Platelets: 172 10*3/uL (ref 150–400)
RBC: 4.76 MIL/uL (ref 4.22–5.81)
RDW: 12.7 % (ref 11.5–15.5)
WBC: 6.6 10*3/uL (ref 4.0–10.5)

## 2015-07-17 LAB — TROPONIN I: Troponin I: <0.03 ng/mL (ref <0.031)

## 2015-07-17 LAB — D-DIMER, QUANTITATIVE: D-Dimer, Quant: <0.27 ug/mL-FEU (ref 0.00–0.50)

## 2015-07-17 MED ORDER — GI COCKTAIL ~~LOC~~
30.0000 mL | Freq: Once | ORAL | Status: AC
  Administered 2015-07-17: 30 mL via ORAL
  Filled 2015-07-17: qty 30

## 2015-07-17 MED ORDER — BENZONATATE 100 MG PO CAPS: 100.0000 mg | ORAL_CAPSULE | Freq: Three times a day (TID) | ORAL | Status: DC | PRN

## 2015-07-17 MED ORDER — ONDANSETRON HCL 4 MG/2ML IJ SOLN
4.0000 mg | Freq: Once | INTRAMUSCULAR | Status: AC
  Administered 2015-07-17: 4 mg via INTRAVENOUS
  Filled 2015-07-17: qty 2

## 2015-07-17 MED ORDER — IOHEXOL 350 MG/ML SOLN
100.0000 mL | Freq: Once | INTRAVENOUS | Status: AC | PRN
  Administered 2015-07-17: 100 mL via INTRAVENOUS

## 2015-07-17 NOTE — ED Notes (Addendum)
Cough, body aches, chest congestion since Saturday.

## 2015-07-17 NOTE — ED Provider Notes (Signed)
CSN: [id number]     Arrival date & time 07/17/15  0815 History   First MD Initiated Contact with Patient 07/17/15 0825     Chief Complaint  Patient presents with  . Chest Pain      HPI  70 y/o M presenting for cough, shortness of breath and body aches for 3 days. He also reports acute worsening of his chronic GERD 1 week ago with worsening epigastric pain that extends up the center of his chest.. He had an upper endoscopy on 3/23 at [facility]. He does not know the results of this procedure.  He's had diffuse body aches across his chest and upper back. He does have chronic upper back pain and this pain does not feel different from his chronic pain. Reports increasing fatigue over the last 3 days. He has no known sick contacts. He denies fevers although he did not check his temperature. Reports some nausea denies vomiting, diarrhea, abdominal pain, denies head ache, vision changes  Denies tobacco use, drink 2 glasses of vodka per day, denies drug use   Past Medical History  Diagnosis Date  . Hypertension   . Diabetes mellitus   . Coronary artery disease   . Stroke (HCC)   . DVT (deep venous thrombosis) (HCC)   . Myocardial infarction [facility])    Past Surgical History  Procedure Laterality Date  . Coronary angioplasty with stent placement     No family history on file. Social History  Substance Use Topics  . Smoking status: Never Smoker   . Smokeless tobacco: Never Used  . Alcohol Use: 2.0 oz/week    4 Standard drinks or equivalent per week    Review of Systems  Constitutional: Negative.   HENT: Negative.   Respiratory: Positive for cough and shortness of breath. Negative for apnea, choking, chest tightness, wheezing and stridor.   Cardiovascular: Negative.  Negative for chest pain, palpitations and leg swelling.  Gastrointestinal: Negative.   Genitourinary: Negative.   Musculoskeletal: Positive for myalgias and back pain.  Neurological: Negative.     Allergies   Lisinopril  Home Medications   Prior to Admission medications   Medication Sig Start Date End Date Taking? Authorizing Provider  ALPRAZolam Prudy Feeler) 1 MG tablet Take 1 mg by mouth 3 (three) times daily as needed for anxiety.   Yes Historical Provider, MD  amLODipine (NORVASC) 2.5 MG tablet Take 2.5 mg by mouth daily.   Yes Historical Provider, MD  aspirin 81 MG tablet Take 81 mg by mouth daily.   Yes Historical Provider, MD  atorvastatin (LIPITOR) 20 MG tablet Take 20 mg by mouth daily.   Yes Historical Provider, MD  buPROPion (WELLBUTRIN XL) 300 MG 24 hr tablet Take 300 mg by mouth daily.   Yes Historical Provider, MD  dabigatran (PRADAXA) 150 MG CAPS capsule Take 150 mg by mouth 2 (two) times daily.   Yes Historical Provider, MD  oxycodone (OXY-IR) 5 MG capsule Take 5 mg by mouth every 4 (four) hours as needed.   Yes Historical Provider, MD  pantoprazole (PROTONIX) 40 MG tablet Take 40 mg by mouth daily.   Yes Historical Provider, MD  valsartan-hydrochlorothiazide (DIOVAN-HCT) 320-25 MG per tablet Take 1 tablet by mouth daily.   Yes Historical Provider, MD  zolpidem (AMBIEN) 10 MG tablet Take 10 mg by mouth at bedtime as needed for sleep.   Yes Historical Provider, MD  sulfamethoxazole-trimethoprim (SEPTRA DS) 800-160 MG per tablet Take 1 tablet by mouth every 12 (twelve) hours. 07/23/13  Margarita Grizzle, MD   BP 134/85 mmHg  Pulse 85  Temp(Src) 98.8 F (37.1 C) (Oral)  Resp 25  Ht  (1.727 m)  Wt 99.338 kg  BMI 33.31 kg/m2  SpO2 92% Physical Exam  Constitutional: He is oriented to person, place, and time. He appears well-developed and well-nourished.  HENT:  Head: Normocephalic.  Eyes: Conjunctivae and EOM are normal. Pupils are equal, round, and reactive to light.  Neck: Normal range of motion.  Cardiovascular: Normal rate and regular rhythm.   Pulmonary/Chest: Effort normal and breath sounds normal. No respiratory distress.  Abdominal: Soft. Bowel sounds are normal.  Obese   Musculoskeletal:  Tenderness to palpation over upper bilateral back  Neurological: He is alert and oriented to person, place, and time.  Skin: Skin is warm and dry.    ED Course  Procedures (including critical care time) Labs Review Labs Reviewed  COMPREHENSIVE METABOLIC PANEL - Abnormal; Notable for the following:    Chloride 99 (*)    Glucose, Bld 116 (*)    BUN 21 (*)    Calcium 8.4 (*)    All other components within normal limits  CBC  TROPONIN I  D-DIMER, QUANTITATIVE (NOT AT [facility])    Imaging Review Dg Chest 2 View  07/17/2015  CLINICAL DATA:  Cough and shortness of breath. EXAM: CHEST  2 VIEW COMPARISON:  07/11/2015 FINDINGS: The heart size and mediastinal contours are within normal limits. Both lungs are clear. The visualized skeletal structures are unremarkable. IMPRESSION: No active cardiopulmonary disease. Electronically Signed   By: Francene Boyers M.D.   On: 07/17/2015 09:12   Ct Angio Chest Pe W/cm &/or Wo Cm  07/17/2015  CLINICAL DATA:  Chest pain with cough and body aches, chest congestion since Saturday, endoscopy last week, esophageal ulcers. Pulmonary embolism versus esophageal pathology? History of hypertension, diabetes, coronary artery disease, stroke, DVT, MI. EXAM: CT ANGIOGRAPHY CHEST WITH CONTRAST TECHNIQUE: Multidetector CT imaging of the chest was performed using the standard protocol during bolus administration of intravenous contrast. Multiplanar CT image reconstructions and MIPs were obtained to evaluate the vascular anatomy. CONTRAST:  OMNIPAQUE IOHEXOL 350 MG/ML SOLN COMPARISON:  None. FINDINGS: Mediastinum/Lymph Nodes: There is no pulmonary embolism identified within the main, lobar or segmental pulmonary arteries bilaterally. Heart size is upper normal. No pericardial effusion. Diffuse coronary artery calcifications noted, presumed stent within the LAD. Scattered atherosclerotic changes of the thoracic aorta. No aortic aneurysm or dissection. No mass  or enlarged lymph nodes seen within the mediastinum or perihilar regions. Esophagus unremarkable, perhaps mild thickening of the walls of the distal esophagus. No fluid or inflammatory stranding within the mediastinum. No pneumomediastinum. Lungs/Pleura: Lungs are clear. No pneumonia. No pleural effusion. No pneumothorax. Trachea and central bronchi are unremarkable. Upper abdomen: No acute findings. Right renal cyst. Probable fatty infiltration of the liver. Musculoskeletal: No acute or suspicious osseous lesion. Mild degenerative change throughout the slightly scoliotic thoracic spine. Superficial soft tissues are unremarkable. Review of the MIP images confirms the above findings. IMPRESSION: 1. No pulmonary embolism seen. 2. Heart size is upper normal. No pericardial effusion. Diffuse coronary artery calcifications with presumed stent in the left anterior descending coronary artery. 3. No aortic aneurysm or dissection. 4. Lungs are clear.  No pneumonia or pleural effusion. Electronically Signed   By: Bary Richard M.D.   On: 07/17/2015 11:18   I have personally reviewed and evaluated these images and lab results as part of my medical decision-making.   EKG Interpretation  Date/Time:  Monday July 17 2015 08:47:26 EDT Ventricular Rate:  78 PR Interval:  200 QRS Duration: 99 QT Interval:  396 QTC Calculation: 451 R Axis:   -16 Text Interpretation:  Sinus rhythm Borderline left axis deviation Low  voltage, precordial leads Consider anterior infarct Baseline wander in  lead(s) V1 V2 Confirmed by MESNER MD, Barbara Cower [phone]) on 07/17/2015 8:56:50  AM      MDM   Final diagnoses:  URI (upper respiratory infection)    70 year old male presenting for upper respiratory tract infection. Physical exam, Laboratory evaluation and chest x-ray negative for signs of acute bacterial infection. Given history of DVTs and subjective shortness of breath CT chest angiogram was performed and was negative for  pulmonary embolism. EKG and troponin was negative 2. Patient was discharged with follow-up with PCP and ED return precautions.  Kortni Hasten A. Kennon Rounds MD, MS Family Medicine Resident PGY-2 Pager [phone]]    Bonney Aid, MD 07/17/15 1311  Marily Memos, MD 07/19/15 1049

## 2015-07-17 NOTE — ED Notes (Signed)
Patient back from CT and hooked back up to monitor and vitals set to every 30 minutes.

## 2015-07-17 NOTE — Discharge Instructions (Signed)
You were seen in the ED for an upper respiratory tract infection. If you have worsening fevers, shortness breath, chest pain returns emergency department for evaluation. The please follow-up with your PCP the next 2-3 days for reevaluation.

## 2015-10-23 DIAGNOSIS — R29898 Other symptoms and signs involving the musculoskeletal system: Secondary | ICD-10-CM

## 2015-10-23 DIAGNOSIS — M19072 Primary osteoarthritis, left ankle and foot: Secondary | ICD-10-CM

## 2015-10-23 HISTORY — DX: Other symptoms and signs involving the musculoskeletal system: R29.898

## 2015-10-23 HISTORY — DX: Primary osteoarthritis, left ankle and foot: M19.072

## 2016-01-18 DIAGNOSIS — M66872 Spontaneous rupture of other tendons, left ankle and foot: Secondary | ICD-10-CM

## 2016-01-18 HISTORY — DX: Spontaneous rupture of other tendons, left ankle and foot: M66.872

## 2016-02-27 DIAGNOSIS — H2513 Age-related nuclear cataract, bilateral: Secondary | ICD-10-CM

## 2016-02-27 DIAGNOSIS — H25013 Cortical age-related cataract, bilateral: Secondary | ICD-10-CM

## 2016-02-27 HISTORY — DX: Age-related nuclear cataract, bilateral: H25.13

## 2016-02-27 HISTORY — DX: Cortical age-related cataract, bilateral: H25.013

## 2016-04-03 DIAGNOSIS — G20 Parkinson's disease: Secondary | ICD-10-CM

## 2016-04-03 DIAGNOSIS — G20A1 Parkinson's disease without dyskinesia, without mention of fluctuations: Secondary | ICD-10-CM

## 2016-04-03 HISTORY — DX: Parkinson's disease: G20

## 2016-04-03 HISTORY — DX: Parkinson's disease without dyskinesia, without mention of fluctuations: G20.A1

## 2016-04-22 HISTORY — PX: LUMBAR LAMINECTOMY: SHX95

## 2016-06-07 DIAGNOSIS — M65971 Unspecified synovitis and tenosynovitis, right ankle and foot: Secondary | ICD-10-CM

## 2016-06-07 DIAGNOSIS — M25571 Pain in right ankle and joints of right foot: Secondary | ICD-10-CM

## 2016-06-07 DIAGNOSIS — G8929 Other chronic pain: Secondary | ICD-10-CM

## 2016-06-07 DIAGNOSIS — M659 Synovitis and tenosynovitis, unspecified: Secondary | ICD-10-CM

## 2016-06-07 HISTORY — DX: Synovitis and tenosynovitis, unspecified: M65.9

## 2016-06-07 HISTORY — DX: Other chronic pain: G89.29

## 2016-06-07 HISTORY — DX: Unspecified synovitis and tenosynovitis, right ankle and foot: M65.971

## 2016-06-07 HISTORY — DX: Pain in right ankle and joints of right foot: M25.571

## 2016-07-31 ENCOUNTER — Encounter (HOSPITAL_BASED_OUTPATIENT_CLINIC_OR_DEPARTMENT_OTHER): Payer: Self-pay

## 2016-07-31 ENCOUNTER — Emergency Department (HOSPITAL_BASED_OUTPATIENT_CLINIC_OR_DEPARTMENT_OTHER)
Admission: EM | Admit: 2016-07-31 | Discharge: 2016-07-31 | Disposition: A | Discharge status: Home / Self Care | Payer: Medicare Other | Attending: Emergency Medicine | Admitting: Emergency Medicine

## 2016-07-31 ENCOUNTER — Emergency Department (HOSPITAL_BASED_OUTPATIENT_CLINIC_OR_DEPARTMENT_OTHER): Payer: Medicare Other

## 2016-07-31 DIAGNOSIS — I252 Old myocardial infarction: Secondary | ICD-10-CM | POA: Insufficient documentation

## 2016-07-31 DIAGNOSIS — E119 Type 2 diabetes mellitus without complications: Secondary | ICD-10-CM | POA: Insufficient documentation

## 2016-07-31 DIAGNOSIS — I2510 Atherosclerotic heart disease of native coronary artery without angina pectoris: Secondary | ICD-10-CM | POA: Insufficient documentation

## 2016-07-31 DIAGNOSIS — Z7982 Long term (current) use of aspirin: Secondary | ICD-10-CM | POA: Diagnosis not present

## 2016-07-31 DIAGNOSIS — G20 Parkinson's disease: Secondary | ICD-10-CM | POA: Insufficient documentation

## 2016-07-31 DIAGNOSIS — J069 Acute upper respiratory infection, unspecified: Secondary | ICD-10-CM | POA: Diagnosis not present

## 2016-07-31 DIAGNOSIS — Z79899 Other long term (current) drug therapy: Secondary | ICD-10-CM | POA: Insufficient documentation

## 2016-07-31 DIAGNOSIS — I10 Essential (primary) hypertension: Secondary | ICD-10-CM | POA: Insufficient documentation

## 2016-07-31 DIAGNOSIS — J40 Bronchitis, not specified as acute or chronic: Secondary | ICD-10-CM | POA: Diagnosis not present

## 2016-07-31 DIAGNOSIS — R05 Cough: Secondary | ICD-10-CM | POA: Diagnosis present

## 2016-07-31 HISTORY — DX: Parkinson's disease: G20

## 2016-07-31 LAB — BASIC METABOLIC PANEL
Anion gap: 7 (ref 5–15)
BUN: 24 mg/dL — ABNORMAL HIGH (ref 6–20)
CO2: 27 mmol/L (ref 22–32)
Calcium: 8.7 mg/dL — ABNORMAL LOW (ref 8.9–10.3)
Chloride: 103 mmol/L (ref 101–111)
Creatinine, Ser: 1.02 mg/dL (ref 0.61–1.24)
GLUCOSE: 102 mg/dL — AB (ref 65–99)
Potassium: 3.7 mmol/L (ref 3.5–5.1)
Sodium: 137 mmol/L (ref 135–145)

## 2016-07-31 LAB — CBC WITH DIFFERENTIAL/PLATELET
BASOS ABS: 0 10*3/uL (ref 0.0–0.1)
Basophils Relative: 0 %
Eosinophils Absolute: 0.5 10*3/uL (ref 0.0–0.7)
Eosinophils Relative: 6 %
HEMATOCRIT: 41.9 % (ref 39.0–52.0)
Hemoglobin: 14.6 g/dL (ref 13.0–17.0)
LYMPHS PCT: 18 %
Lymphs Abs: 1.7 10*3/uL (ref 0.7–4.0)
MCH: 32.2 pg (ref 26.0–34.0)
MCHC: 34.8 g/dL (ref 30.0–36.0)
MCV: 92.3 fL (ref 78.0–100.0)
Monocytes Absolute: 1.1 10*3/uL — ABNORMAL HIGH (ref 0.1–1.0)
Monocytes Relative: 11 %
NEUTROS ABS: 6.2 10*3/uL (ref 1.7–7.7)
Neutrophils Relative %: 65 %
PLATELETS: 191 10*3/uL (ref 150–400)
RBC: 4.54 MIL/uL (ref 4.22–5.81)
RDW: 13.5 % (ref 11.5–15.5)
WBC: 9.5 10*3/uL (ref 4.0–10.5)

## 2016-07-31 LAB — TROPONIN I: Troponin I: <0.03 ng/mL (ref <0.03)

## 2016-07-31 MED ORDER — DM-GUAIFENESIN ER 30-600 MG PO TB12: 1.0000 | ORAL_TABLET | Freq: Two times a day (BID) | ORAL | 0 refills | Status: DC

## 2016-07-31 NOTE — ED Triage Notes (Addendum)
c/o flu like sx started yesterday-pt with "congestion/tightness to my chest"-pt with dry dry duinng traige-NAD-steady gait

## 2016-07-31 NOTE — ED Provider Notes (Signed)
[facility] Provider Note   CSN: [id number] Arrival date & time: 07/31/16  1113     History   Chief Complaint Chief Complaint  Patient presents with  . Cough    HPI [name]. is a 71 y.o. male.  Patient presents with sore throat cough occasionally productive tightness in his chest. Present for 2 days. Patient's wife recently diagnosed bronchitis been sick for about 5 days. His illnesses similar to hers. Patient denies any fevers or bodyaches. Patient does have a history of coronary artery disease. The chest discomfort as a tightness in his chest. Been persistent for 2 days. Not worse today.      Past Medical History:  Diagnosis Date  . Coronary artery disease   . Diabetes mellitus   . DVT (deep venous thrombosis) (HCC)   . Hypertension   . Myocardial infarction   . Parkinson's disease (HCC)   . Stroke [facility])     There are no active problems to display for this patient.   Past Surgical History:  Procedure Laterality Date  . CORONARY ANGIOPLASTY WITH STENT PLACEMENT         Home Medications    Prior to Admission medications   Medication Sig Start Date End Date Taking? Authorizing Provider  CARBIDOPA-LEVODOPA EN by Enteral route.   Yes Historical Provider, MD  ALPRAZolam Prudy Feeler) 1 MG tablet Take 1 mg by mouth 3 (three) times daily as needed for anxiety.    Historical Provider, MD  amLODipine (NORVASC) 2.5 MG tablet Take 2.5 mg by mouth daily.    Historical Provider, MD  aspirin 81 MG tablet Take 81 mg by mouth daily.    Historical Provider, MD  atorvastatin (LIPITOR) 20 MG tablet Take 20 mg by mouth daily.    Historical Provider, MD  buPROPion (WELLBUTRIN XL) 300 MG 24 hr tablet Take 300 mg by mouth daily.    Historical Provider, MD  dabigatran (PRADAXA) 150 MG CAPS capsule Take 150 mg by mouth 2 (two) times daily.    Historical Provider, MD  dextromethorphan-guaiFENesin (MUCINEX DM) 30-600 MG 12hr tablet Take 1 tablet by mouth 2 (two) times  daily. 07/31/16   Vanetta Mulders, MD  oxycodone (OXY-IR) 5 MG capsule Take 5 mg by mouth every 4 (four) hours as needed.    Historical Provider, MD  pantoprazole (PROTONIX) 40 MG tablet Take 40 mg by mouth daily.    Historical Provider, MD  valsartan-hydrochlorothiazide (DIOVAN-HCT) 320-25 MG per tablet Take 1 tablet by mouth daily.    Historical Provider, MD  zolpidem (AMBIEN) 10 MG tablet Take 10 mg by mouth at bedtime as needed for sleep.    Historical Provider, MD    Family History No family history on file.  Social History Social History  Substance Use Topics  . Smoking status: Never Smoker  . Smokeless tobacco: Never Used  . Alcohol use Yes     Comment: daily     Allergies   Lisinopril   Review of Systems Review of Systems  Constitutional: Negative for fever.  HENT: Positive for congestion and sore throat.   Eyes: Negative for visual disturbance.  Respiratory: Positive for cough.   Cardiovascular: Positive for chest pain.  Gastrointestinal: Negative for abdominal pain, diarrhea, nausea and vomiting.  Genitourinary: Negative for dysuria.  Musculoskeletal: Negative for myalgias.  Skin: Negative for rash.  Neurological: Negative for headaches.  Hematological: Does not bruise/bleed easily.  Psychiatric/Behavioral: Negative for confusion.     Physical Exam Updated Vital Signs BP  131/86 (BP Location: Right Arm)   Pulse (!) 50   Temp 98.2 F (36.8 C) (Oral)   Resp 14   Ht 5\' 8"  (1.727 m)   Wt 96.2 kg   SpO2 99%   BMI 32.23 kg/m   Physical Exam  Constitutional: He is oriented to person, place, and time. He appears well-developed and well-nourished. No distress.  HENT:  Head: Normocephalic and atraumatic.  Mouth/Throat: Oropharynx is clear and moist. No oropharyngeal exudate.  Eyes: EOM are normal. Pupils are equal, round, and reactive to light.  Neck: Normal range of motion. Neck supple.  Cardiovascular: Normal rate, regular rhythm and normal heart sounds.     Pulmonary/Chest: Effort normal and breath sounds normal. No respiratory distress.  Abdominal: Soft. Bowel sounds are normal. There is no tenderness.  Musculoskeletal: Normal range of motion.  Neurological: He is oriented to person, place, and time. No cranial nerve deficit or sensory deficit. He exhibits normal muscle tone. Coordination normal.  Skin: Skin is warm. No rash noted.  Nursing note and vitals reviewed.    ED Treatments / Results  Labs (all labs ordered are listed, but only abnormal results are displayed) Labs Reviewed  CBC WITH DIFFERENTIAL/PLATELET - Abnormal; Notable for the following:       Result Value   Monocytes Absolute 1.1 (*)    All other components within normal limits  BASIC METABOLIC PANEL - Abnormal; Notable for the following:    Glucose, Bld 102 (*)    BUN 24 (*)    Calcium 8.7 (*)    All other components within normal limits  TROPONIN I    EKG  EKG Interpretation  Date/Time:  Wednesday July 31 2016 12:12:26 EDT Ventricular Rate:  49 PR Interval:    QRS Duration: 103 QT Interval:  434 QTC Calculation: 392 R Axis:   -17 Text Interpretation:  Sinus bradycardia Borderline left axis deviation Low voltage, precordial leads Consider anterior infarct Baseline wander in lead(s) V1 No significant change since last tracing Confirmed by Mkenzie Dotts  MD, Deniro Laymon [phone]) on 07/31/2016 12:21:04 PM       Radiology Dg Chest 2 View  Result Date: 07/31/2016 CLINICAL DATA:  Cough over the last 3 days. Congestion and pressure in the chest. EXAM: CHEST  2 VIEW COMPARISON:  07/17/2015 FINDINGS: Chronic cardiomegaly. Chronic aortic atherosclerosis. Coronary stents and/or calcification. Vascularity is otherwise normal. The lungs are clear. No effusions. No significant bone finding. IMPRESSION: No active disease. Cardiomegaly. Aortic atherosclerosis. Coronary stents and/or calcification. Electronically Signed   By: Paulina Fusi M.D.   On: 07/31/2016 11:47     Procedures Procedures (including critical care time)  Medications Ordered in ED Medications - No data to display   Initial Impression / Assessment and Plan / ED Course  I have reviewed the triage vital signs and the nursing notes.  Pertinent labs & imaging results that were available during my care of the patient were reviewed by me and considered in my medical decision making (see chart for details).    Patient symptoms consistent with upper respiratory infection bronchitis. Patient's wife with very similar symptoms. This doesn't seem to be flulike and the fact that there is really no bodyaches no fever. Just straight negative for pneumonia. History of cardiac stuff but 2 days worth of chest discomfort in a negative troponin makes this unlikely to be coronary disease related. Will treat symptomatically. Patient stable for discharge home.   Final Clinical Impressions(s) / ED Diagnoses   Final diagnoses:  Acute  upper respiratory infection  Bronchitis    New Prescriptions New Prescriptions   DEXTROMETHORPHAN-GUAIFENESIN (MUCINEX DM) 30-600 MG 12HR TABLET    Take 1 tablet by mouth 2 (two) times daily.     Vanetta Mulders, MD 07/31/16 1407

## 2016-07-31 NOTE — Discharge Instructions (Signed)
Return for any new or worse symptoms. Take Mucinex DM as directed. Chest x-ray was negative for pneumonia. Cardiac marker troponin was negative. Symptoms seem to be consistent with an upper respiratory infection and bronchitis does not seem to be flulike.

## 2016-09-09 DIAGNOSIS — L209 Atopic dermatitis, unspecified: Secondary | ICD-10-CM

## 2016-09-09 DIAGNOSIS — N529 Male erectile dysfunction, unspecified: Secondary | ICD-10-CM

## 2016-09-09 HISTORY — DX: Atopic dermatitis, unspecified: L20.9

## 2016-09-09 HISTORY — DX: Male erectile dysfunction, unspecified: N52.9

## 2016-09-28 DIAGNOSIS — D6861 Antiphospholipid syndrome: Secondary | ICD-10-CM

## 2016-09-28 HISTORY — DX: Antiphospholipid syndrome: D68.61

## 2016-12-17 DIAGNOSIS — H532 Diplopia: Secondary | ICD-10-CM

## 2016-12-17 HISTORY — DX: Diplopia: H53.2

## 2017-04-21 DIAGNOSIS — F418 Other specified anxiety disorders: Secondary | ICD-10-CM

## 2017-04-21 DIAGNOSIS — D6859 Other primary thrombophilia: Secondary | ICD-10-CM

## 2017-04-21 DIAGNOSIS — I252 Old myocardial infarction: Secondary | ICD-10-CM

## 2017-04-21 HISTORY — DX: Old myocardial infarction: I25.2

## 2017-04-21 HISTORY — DX: Other primary thrombophilia: D68.59

## 2017-04-21 HISTORY — DX: Other specified anxiety disorders: F41.8

## 2017-10-11 ENCOUNTER — Emergency Department (HOSPITAL_BASED_OUTPATIENT_CLINIC_OR_DEPARTMENT_OTHER): Payer: Medicare Other

## 2017-10-11 ENCOUNTER — Emergency Department (HOSPITAL_BASED_OUTPATIENT_CLINIC_OR_DEPARTMENT_OTHER)
Admission: EM | Admit: 2017-10-11 | Discharge: 2017-10-11 | Disposition: A | Discharge status: Home / Self Care | Payer: Medicare Other | Attending: Emergency Medicine | Admitting: Emergency Medicine

## 2017-10-11 ENCOUNTER — Other Ambulatory Visit: Payer: Self-pay

## 2017-10-11 ENCOUNTER — Encounter (HOSPITAL_BASED_OUTPATIENT_CLINIC_OR_DEPARTMENT_OTHER): Payer: Self-pay | Admitting: Emergency Medicine

## 2017-10-11 DIAGNOSIS — Z8673 Personal history of transient ischemic attack (TIA), and cerebral infarction without residual deficits: Secondary | ICD-10-CM | POA: Insufficient documentation

## 2017-10-11 DIAGNOSIS — Z79899 Other long term (current) drug therapy: Secondary | ICD-10-CM | POA: Diagnosis not present

## 2017-10-11 DIAGNOSIS — I10 Essential (primary) hypertension: Secondary | ICD-10-CM | POA: Insufficient documentation

## 2017-10-11 DIAGNOSIS — E119 Type 2 diabetes mellitus without complications: Secondary | ICD-10-CM | POA: Insufficient documentation

## 2017-10-11 DIAGNOSIS — Z7982 Long term (current) use of aspirin: Secondary | ICD-10-CM | POA: Insufficient documentation

## 2017-10-11 DIAGNOSIS — R1011 Right upper quadrant pain: Secondary | ICD-10-CM | POA: Diagnosis present

## 2017-10-11 DIAGNOSIS — I2510 Atherosclerotic heart disease of native coronary artery without angina pectoris: Secondary | ICD-10-CM | POA: Insufficient documentation

## 2017-10-11 DIAGNOSIS — N281 Cyst of kidney, acquired: Secondary | ICD-10-CM

## 2017-10-11 DIAGNOSIS — R109 Unspecified abdominal pain: Secondary | ICD-10-CM

## 2017-10-11 LAB — URINALYSIS, ROUTINE W REFLEX MICROSCOPIC
Bilirubin Urine: NEGATIVE
Glucose, UA: NEGATIVE mg/dL
HGB URINE DIPSTICK: NEGATIVE
Ketones, ur: NEGATIVE mg/dL
Leukocytes, UA: NEGATIVE
Nitrite: NEGATIVE
PH: 5.5 (ref 5.0–8.0)
Protein, ur: NEGATIVE mg/dL

## 2017-10-11 LAB — CBC WITH DIFFERENTIAL/PLATELET
BASOS ABS: 0 10*3/uL (ref 0.0–0.1)
BASOS PCT: 1 %
EOS ABS: 0.6 10*3/uL (ref 0.0–0.7)
Eosinophils Relative: 10 %
HCT: 43.4 % (ref 39.0–52.0)
Hemoglobin: 15 g/dL (ref 13.0–17.0)
Lymphocytes Relative: 27 %
Lymphs Abs: 1.5 10*3/uL (ref 0.7–4.0)
MCH: 31.3 pg (ref 26.0–34.0)
MCHC: 34.6 g/dL (ref 30.0–36.0)
MCV: 90.4 fL (ref 78.0–100.0)
Monocytes Absolute: 0.6 10*3/uL (ref 0.1–1.0)
Monocytes Relative: 10 %
NEUTROS ABS: 2.9 10*3/uL (ref 1.7–7.7)
Neutrophils Relative %: 52 %
PLATELETS: 185 10*3/uL (ref 150–400)
RBC: 4.8 MIL/uL (ref 4.22–5.81)
RDW: 13.7 % (ref 11.5–15.5)
WBC: 5.5 10*3/uL (ref 4.0–10.5)

## 2017-10-11 LAB — COMPREHENSIVE METABOLIC PANEL
ALBUMIN: 3.9 g/dL (ref 3.5–5.0)
ALK PHOS: 90 U/L (ref 38–126)
ALT: 7 U/L — AB (ref 17–63)
ANION GAP: 7 (ref 5–15)
AST: 28 U/L (ref 15–41)
BILIRUBIN TOTAL: 0.7 mg/dL (ref 0.3–1.2)
BUN: 23 mg/dL — ABNORMAL HIGH (ref 6–20)
CALCIUM: 8.3 mg/dL — AB (ref 8.9–10.3)
CO2: 25 mmol/L (ref 22–32)
CREATININE: 1.15 mg/dL (ref 0.61–1.24)
Chloride: 109 mmol/L (ref 101–111)
GFR calc non Af Amer: >60 mL/min (ref >60)
GLUCOSE: 129 mg/dL — AB (ref 65–99)
Potassium: 4 mmol/L (ref 3.5–5.1)
SODIUM: 141 mmol/L (ref 135–145)
TOTAL PROTEIN: 6.9 g/dL (ref 6.5–8.1)

## 2017-10-11 NOTE — ED Provider Notes (Signed)
[facility] EMERGENCY DEPARTMENT Provider Note   CSN: [id number] Arrival date & time: 10/11/17  8295     History   Chief Complaint Chief Complaint  Patient presents with  . Flank Pain    HPI Christian Apuzzo. is a 72 y.o. male.  HPI  72 year old male with a history of coronary disease and prior stroke presents with right flank pain.  Started about 2 weeks ago and has been intermittent.  It is a sharp pain when it comes.  Nothing he does seems to make it come or go.  However since the middle the night it has woken him up from sleep and not allow him to sleep.  Is more severe.  Currently is about a 4 out of 10.  He has been weaning himself off of Tylenol with codeine that he was put on about 3 days ago for a tooth.  He is also been taking ibuprofen.  However these medicines have not seemed to touch the pain in his flank.  About 2 weeks ago he also got over shingles on his right side.  Today the area of soreness feels numb when he touches it.  There is no abdominal pain but it seems to radiate into his back. No urinary symptoms.  Past Medical History:  Diagnosis Date  . Coronary artery disease   . Diabetes mellitus   . DVT (deep venous thrombosis) (HCC)   . Hypertension   . Myocardial infarction (HCC)   . Parkinson's disease (HCC)   . Stroke [facility])     There are no active problems to display for this patient.   Past Surgical History:  Procedure Laterality Date  . CORONARY ANGIOPLASTY WITH STENT PLACEMENT          Home Medications    Prior to Admission medications   Medication Sig Start Date End Date Taking? Authorizing Provider  ALPRAZolam Prudy Feeler) 1 MG tablet Take 1 mg by mouth 3 (three) times daily as needed for anxiety.    [provider]  amLODipine (NORVASC) 2.5 MG tablet Take 2.5 mg by mouth daily.    [provider]  aspirin 81 MG tablet Take 81 mg by mouth daily.    [provider]  atorvastatin (LIPITOR) 20 MG tablet Take 20  mg by mouth daily.    [provider]  buPROPion (WELLBUTRIN XL) 300 MG 24 hr tablet Take 300 mg by mouth daily.    [provider]  CARBIDOPA-LEVODOPA EN by Enteral route.    [provider]  dabigatran (PRADAXA) 150 MG CAPS capsule Take 150 mg by mouth 2 (two) times daily.    [provider]  dextromethorphan-guaiFENesin (MUCINEX DM) 30-600 MG 12hr tablet Take 1 tablet by mouth 2 (two) times daily. 07/31/16   Vanetta Mulders, MD  oxycodone (OXY-IR) 5 MG capsule Take 5 mg by mouth every 4 (four) hours as needed.    [provider]  pantoprazole (PROTONIX) 40 MG tablet Take 40 mg by mouth daily.    [provider]  valsartan-hydrochlorothiazide (DIOVAN-HCT) 320-25 MG per tablet Take 1 tablet by mouth daily.    [provider]  zolpidem (AMBIEN) 10 MG tablet Take 10 mg by mouth at bedtime as needed for sleep.    [provider]    Family History No family history on file.  Social History Social History   Tobacco Use  . Smoking status: Never Smoker  . Smokeless tobacco: Never Used  Substance Use Topics  .  Alcohol use: Yes    Comment: daily  . Drug use: No     Allergies   Lisinopril   Review of Systems Review of Systems  Constitutional: Negative for fever.  Gastrointestinal: Negative for abdominal pain and vomiting.  Genitourinary: Positive for flank pain. Negative for dysuria and hematuria.  Musculoskeletal: Positive for back pain.  Skin: Positive for rash.  All other systems reviewed and are negative.    Physical Exam Updated Vital Signs BP (!) 141/90 (BP Location: Left Arm)   Pulse (!) 54   Temp 98.8 F (37.1 C) (Oral)   Resp 16   Ht 5\' 8"  (1.727 m)   Wt 90.7 kg (200 lb)   SpO2 98%   BMI 30.41 kg/m   Physical Exam  Constitutional: He is oriented to person, place, and time. He appears well-developed and well-nourished. No distress.  HENT:  Head: Normocephalic and atraumatic.  Right Ear:  External ear normal.  Left Ear: External ear normal.  Nose: Nose normal.  Eyes: Right eye exhibits no discharge. Left eye exhibits no discharge.  Neck: Neck supple.  Cardiovascular: Normal rate, regular rhythm and normal heart sounds.  Pulmonary/Chest: Effort normal and breath sounds normal.  Abdominal: Soft. He exhibits no distension. There is no tenderness.  Musculoskeletal: He exhibits no edema.       Arms: Neurological: He is alert and oriented to person, place, and time.  Skin: Skin is warm and dry. Rash noted. He is not diaphoretic.     Nursing note and vitals reviewed.    ED Treatments / Results  Labs (all labs ordered are listed, but only abnormal results are displayed) Labs Reviewed  URINALYSIS, ROUTINE W REFLEX MICROSCOPIC - Abnormal; Notable for the following components:      Result Value   Specific Gravity, Urine >1.030 (*)    All other components within normal limits  COMPREHENSIVE METABOLIC PANEL - Abnormal; Notable for the following components:   Glucose, Bld 129 (*)    BUN 23 (*)    Calcium 8.3 (*)    ALT 7 (*)    All other components within normal limits  CBC WITH DIFFERENTIAL/PLATELET    EKG None  Radiology Ct Renal Stone Study  Result Date: 10/11/2017 CLINICAL DATA:  Right flank pain x several weeks, worse last pm and today, denies urinary symptoms(dysuria,hematuria,painful urination), no hx renal stones EXAM: CT ABDOMEN AND PELVIS WITHOUT CONTRAST TECHNIQUE: Multidetector CT imaging of the abdomen and pelvis was performed following the standard protocol without IV contrast. COMPARISON:  05/18/2017 FINDINGS: Lower chest: Clear lung bases.  Heart normal in size. Hepatobiliary: Liver demonstrates decreased attenuation consistent with fatty infiltration. No liver mass or focal lesion. Normal gallbladder. No bile duct dilation. Pancreas: Unremarkable. No pancreatic ductal dilatation or surrounding inflammatory changes. Spleen: Normal in size without focal  abnormality. Adrenals/Urinary Tract: No adrenal masses. Irregular low-attenuation mass with associated stranding projects from the posteromedial aspect of the right kidney upper pole, measuring approximately 2 cm, unchanged when compared to the prior CT. Vague area of low attenuation in posterior mid to lower pole the right kidney is consistent with a cyst, also unchanged. No other renal masses. No intrarenal stones. No hydronephrosis. Ureters are normal course and in caliber.  No ureteral stones. Bladder is unremarkable. Stomach/Bowel: Stomach is unremarkable. No bowel dilation to suggest obstruction. No wall thickening or inflammation. Normal sized appendix. Small appendicoliths in the appendiceal tip, stable from the prior study. Vascular/Lymphatic: Aortic atherosclerotic calcifications. No aneurysm. No enlarged lymph nodes.  Reproductive: Mild prostatic enlargement, 4.8 cm in greatest dimension. Other: No abdominal wall hernia or abnormality. No abdominopelvic ascites. Musculoskeletal: No fracture or acute finding. No osteoblastic or osteolytic lesions. There are significant degenerative changes of the visualized spine. IMPRESSION: 1. No acute findings.  No findings to account for right flank pain. 2. Irregular 2 cm mass from the posterior upper pole the right kidney, unchanged from the most recent CT. This is consistent with a partly involuted cyst. It had measured 5.1 cm on a study dated 07/11/2015. 3. Aortic atherosclerosis. 4. Hepatic steatosis. Electronically Signed   By: Amie Portland M.D.   On: 10/11/2017 09:26    Procedures Procedures (including critical care time)  Medications Ordered in ED Medications - No data to display   Initial Impression / Assessment and Plan / ED Course  I have reviewed the triage vital signs and the nursing notes.  Pertinent labs & imaging results that were available during my care of the patient were reviewed by me and considered in my medical decision making (see  chart for details).     Patient declines treatment for his pain which is moderate.  His labs are unremarkable, especially compared to baseline.  Given age and symptoms, CT obtained.  No obvious findings that would explain pain.  He was made aware of the renal cyst but this is improved from 2 years ago.  This could be postherpetic neuralgia given the recent zoster.  He is already on gabapentin and I will recommend he go from 200 mg twice daily to 200 mg 3 times daily for now.  Follow-up with PCP.  Return precautions.  Final Clinical Impressions(s) / ED Diagnoses   Final diagnoses:  Right flank pain  Renal cyst, right    ED Discharge Orders    None       Pricilla Loveless, MD 10/11/17 1029

## 2017-10-11 NOTE — ED Triage Notes (Signed)
L flank pain radiating into his back x 1 week. Denies N/v. Denies urinary symptoms.

## 2017-10-11 NOTE — ED Notes (Signed)
Patient transported to CT 

## 2017-10-11 NOTE — Discharge Instructions (Signed)
Return to the ER or see her doctor if you develop fever, worsening pain, vomiting, or other new/concerning symptoms.  Otherwise, increase your Neurontin to 200 mg 3 times per day and follow-up with your doctor in about 1 week.

## 2017-10-17 DIAGNOSIS — M5116 Intervertebral disc disorders with radiculopathy, lumbar region: Secondary | ICD-10-CM

## 2017-10-17 HISTORY — DX: Intervertebral disc disorders with radiculopathy, lumbar region: M51.16

## 2018-02-25 DIAGNOSIS — M461 Sacroiliitis, not elsewhere classified: Secondary | ICD-10-CM

## 2018-02-25 HISTORY — DX: Sacroiliitis, not elsewhere classified: M46.1

## 2018-06-02 DIAGNOSIS — M2142 Flat foot [pes planus] (acquired), left foot: Secondary | ICD-10-CM

## 2018-06-02 DIAGNOSIS — M76822 Posterior tibial tendinitis, left leg: Secondary | ICD-10-CM

## 2018-06-02 HISTORY — DX: Posterior tibial tendinitis, left leg: M76.822

## 2018-06-02 HISTORY — DX: Flat foot (pes planus) (acquired), left foot: M21.42

## 2018-10-21 ENCOUNTER — Other Ambulatory Visit: Payer: Self-pay

## 2018-10-21 ENCOUNTER — Encounter (HOSPITAL_BASED_OUTPATIENT_CLINIC_OR_DEPARTMENT_OTHER): Payer: Self-pay

## 2018-10-21 ENCOUNTER — Emergency Department (HOSPITAL_BASED_OUTPATIENT_CLINIC_OR_DEPARTMENT_OTHER)
Admission: EM | Admit: 2018-10-21 | Discharge: 2018-10-21 | Disposition: A | Discharge status: Home / Self Care | Payer: Medicare Other | Attending: Emergency Medicine | Admitting: Emergency Medicine

## 2018-10-21 ENCOUNTER — Emergency Department (HOSPITAL_BASED_OUTPATIENT_CLINIC_OR_DEPARTMENT_OTHER): Payer: Medicare Other

## 2018-10-21 DIAGNOSIS — Z7982 Long term (current) use of aspirin: Secondary | ICD-10-CM | POA: Insufficient documentation

## 2018-10-21 DIAGNOSIS — M79641 Pain in right hand: Secondary | ICD-10-CM | POA: Diagnosis present

## 2018-10-21 DIAGNOSIS — G20 Parkinson's disease: Secondary | ICD-10-CM | POA: Insufficient documentation

## 2018-10-21 DIAGNOSIS — I2510 Atherosclerotic heart disease of native coronary artery without angina pectoris: Secondary | ICD-10-CM | POA: Diagnosis not present

## 2018-10-21 DIAGNOSIS — E119 Type 2 diabetes mellitus without complications: Secondary | ICD-10-CM | POA: Insufficient documentation

## 2018-10-21 DIAGNOSIS — I10 Essential (primary) hypertension: Secondary | ICD-10-CM | POA: Diagnosis not present

## 2018-10-21 DIAGNOSIS — Z8673 Personal history of transient ischemic attack (TIA), and cerebral infarction without residual deficits: Secondary | ICD-10-CM | POA: Insufficient documentation

## 2018-10-21 DIAGNOSIS — Z79899 Other long term (current) drug therapy: Secondary | ICD-10-CM | POA: Insufficient documentation

## 2018-10-21 MED ORDER — ACETAMINOPHEN 500 MG PO TABS
1000.0000 mg | ORAL_TABLET | Freq: Once | ORAL | Status: AC
  Administered 2018-10-21: 1000 mg via ORAL
  Filled 2018-10-21: qty 2

## 2018-10-21 NOTE — ED Notes (Signed)
Patient transported to X-ray 

## 2018-10-21 NOTE — Discharge Instructions (Addendum)
You were seen in the emergency department for right hand pain.  Your x-rays did not show any fracture but there is some suggestion of some arthritic changes in the joints.  You should continue the Tylenol and follow-up with your doctor or consider following up with 1 of our hand surgeons for evaluation.

## 2018-10-21 NOTE — ED Triage Notes (Signed)
Pt c/o right hand pain x months-denies injury-states he came in because he normally takes tylenol and "left it at the house"-NAD-steady gait

## 2018-10-21 NOTE — ED Provider Notes (Signed)
[place] EMERGENCY DEPARTMENT Provider Note   CSN: [id number] Arrival date & time: 10/21/18  1658     History   Chief Complaint Chief Complaint  Patient presents with  . Hand Pain    HPI Christian Breeze. is a 73 y.o. male.  He has a history of coronary disease and on Pradaxa.  He is presenting complaining of worsening right hand pain that is been going on for 3 months.  The pain is primarily located at his third MCP.  There is been no trauma.  He said he usually takes a gram of Tylenol twice a day that helps with the pain but he did not take it this morning and the pain was much worse this afternoon.  He does not usually take NSAIDs secondary to Pradaxa.  He has not had any kind of testing for his hand pain.  He said he called his PCP last month and never got a call back and he decided to not follow-up with it.     The history is provided by the patient.  Hand Pain This is a new problem. Episode onset: 3 months. The problem occurs constantly. The problem has not changed since onset.Pertinent negatives include no chest pain, no abdominal pain, no headaches and no shortness of breath. The symptoms are aggravated by bending. The symptoms are relieved by acetaminophen. He has tried nothing for the symptoms. The treatment provided no relief.    Past Medical History:  Diagnosis Date  . Coronary artery disease   . Diabetes mellitus   . DVT (deep venous thrombosis) ([place])   . Hypertension   . Myocardial infarction ([place])   . Parkinson's disease ([place])   . Stroke [facility])     There are no active problems to display for this patient.   Past Surgical History:  Procedure Laterality Date  . CORONARY ANGIOPLASTY WITH STENT PLACEMENT          Home Medications    Prior to Admission medications   Medication Sig Start Date End Date Taking? Authorizing Provider  ALPRAZolam Duanne Moron) 1 MG tablet Take 1 mg by mouth 3 (three) times daily as needed for anxiety.    [provider]  amLODipine (NORVASC) 2.5 MG tablet Take 2.5 mg by mouth daily.    [provider]  aspirin 81 MG tablet Take 81 mg by mouth daily.    [provider]  atorvastatin (LIPITOR) 20 MG tablet Take 20 mg by mouth daily.    [provider]  buPROPion (WELLBUTRIN XL) 300 MG 24 hr tablet Take 300 mg by mouth daily.    [provider]  CARBIDOPA-LEVODOPA EN by Enteral route.    [provider]  dabigatran (PRADAXA) 150 MG CAPS capsule Take 150 mg by mouth 2 (two) times daily.    [provider]  dextromethorphan-guaiFENesin (MUCINEX DM) 30-600 MG 12hr tablet Take 1 tablet by mouth 2 (two) times daily. 07/31/16   Fredia Sorrow, MD  oxycodone (OXY-IR) 5 MG capsule Take 5 mg by mouth every 4 (four) hours as needed.    [provider]  pantoprazole (PROTONIX) 40 MG tablet Take 40 mg by mouth daily.    [provider]  valsartan-hydrochlorothiazide (DIOVAN-HCT) 320-25 MG per tablet Take 1 tablet by mouth daily.    [provider]  zolpidem (AMBIEN) 10 MG tablet Take 10 mg by mouth at bedtime as needed for sleep.    [provider]    Family History  No family history on file.  Social History Social History   Tobacco Use  . Smoking status: Never Smoker  . Smokeless tobacco: Never Used  Substance Use Topics  . Alcohol use: Yes    Comment: daily  . Drug use: No     Allergies   Lisinopril   Review of Systems Review of Systems  Constitutional: Negative for fever.  HENT: Negative for sore throat.   Eyes: Negative for visual disturbance.  Respiratory: Negative for shortness of breath.   Cardiovascular: Negative for chest pain.  Gastrointestinal: Negative for abdominal pain.  Genitourinary: Negative for dysuria.  Musculoskeletal: Negative for neck pain.  Skin: Negative for rash.  Neurological: Negative for headaches.     Physical Exam Updated Vital Signs BP (!) 141/109 (BP Location:  Left Arm)   Pulse 88   Temp 98.7 F (37.1 C) (Oral)   Resp 18   SpO2 96%   Physical Exam Vitals signs and nursing note reviewed.  Constitutional:      Appearance: He is well-developed.  HENT:     Head: Normocephalic and atraumatic.  Eyes:     Conjunctiva/sclera: Conjunctivae normal.  Neck:     Musculoskeletal: Neck supple.  Pulmonary:     Effort: Pulmonary effort is normal.  Musculoskeletal:        General: Swelling and tenderness present.     Comments: Patient has a little bit of swelling over the dorsum of his hand especially at his third MCP.  He is got full range of motion although has a little bit of stiffness in the joint.  It is tender to palpation.  There is no overlying warmth or erythema and no open wounds.  Skin:    General: Skin is warm and dry.     Capillary Refill: Capillary refill takes less than 2 seconds.  Neurological:     General: No focal deficit present.     Mental Status: He is alert.     GCS: GCS eye subscore is 4. GCS verbal subscore is 5. GCS motor subscore is 6.      ED Treatments / Results  Labs (all labs ordered are listed, but only abnormal results are displayed) Labs Reviewed - No data to display  EKG None  Radiology Dg Hand Complete Right  Result Date: 10/21/2018 CLINICAL DATA:  Pain in the third metacarpal. EXAM: RIGHT HAND - COMPLETE 3+ VIEW COMPARISON:  None. FINDINGS: There is no acute displaced fracture or dislocation. There are degenerative changes of the interphalangeal joints. There are degenerative changes of the third metacarpophalangeal joint. Vascular calcifications are noted. IMPRESSION: No acute displaced fracture or dislocation. Degenerative changes as above. Electronically Signed   By: Katherine Mantle M.D.   On: 10/21/2018 17:50    Procedures Procedures (including critical care time)  Medications Ordered in ED Medications  acetaminophen (TYLENOL) tablet 1,000 mg (has no administration in time range)     Initial  Impression / Assessment and Plan / ED Course  I have reviewed the triage vital signs and the nursing notes.  Pertinent labs & imaging results that were available during my care of the patient were reviewed by me and considered in my medical decision making (see chart for details).  Clinical Course as of Oct 20 1920  Wed Oct 21, 2018  1721 Differential diagnosis includes fracture, dislocation, arthritis, gout.  I offered him some Tylenol here and work on to get an x-ray to see if there is any obvious findings.  Ultimately I said  if we do not see anything I would like to refer him onto 1 of the hand doctors for evaluation.   [MB]  1755 Patient's x-ray showing some DJD but no obvious lytic lesion or fracture.  We will continue plan for discharge and hand follow-up.   [MB]    Clinical Course User Index [MB] Terrilee FilesButler, Michael C, MD       Final Clinical Impressions(s) / ED Diagnoses   Final diagnoses:  Right hand pain    ED Discharge Orders    None       Terrilee FilesButler, Michael C, MD 10/21/18 [id number]

## 2019-06-03 DIAGNOSIS — M79601 Pain in right arm: Secondary | ICD-10-CM

## 2019-06-03 HISTORY — DX: Pain in right arm: M79.601

## 2019-09-25 ENCOUNTER — Emergency Department (HOSPITAL_BASED_OUTPATIENT_CLINIC_OR_DEPARTMENT_OTHER): Payer: Medicare Other

## 2019-09-25 ENCOUNTER — Inpatient Hospital Stay (HOSPITAL_BASED_OUTPATIENT_CLINIC_OR_DEPARTMENT_OTHER)
Admission: EM | Admit: 2019-09-25 | Discharge: 2019-09-28 | DRG: 247 | Disposition: A | Discharge status: Home / Self Care | Payer: Medicare Other | Attending: Cardiology | Admitting: Cardiology

## 2019-09-25 ENCOUNTER — Encounter (HOSPITAL_BASED_OUTPATIENT_CLINIC_OR_DEPARTMENT_OTHER): Payer: Self-pay | Admitting: Emergency Medicine

## 2019-09-25 ENCOUNTER — Other Ambulatory Visit: Payer: Self-pay

## 2019-09-25 DIAGNOSIS — Z7982 Long term (current) use of aspirin: Secondary | ICD-10-CM | POA: Diagnosis not present

## 2019-09-25 DIAGNOSIS — E119 Type 2 diabetes mellitus without complications: Secondary | ICD-10-CM | POA: Diagnosis not present

## 2019-09-25 DIAGNOSIS — I340 Nonrheumatic mitral (valve) insufficiency: Secondary | ICD-10-CM | POA: Diagnosis not present

## 2019-09-25 DIAGNOSIS — R2681 Unsteadiness on feet: Secondary | ICD-10-CM | POA: Diagnosis not present

## 2019-09-25 DIAGNOSIS — I249 Acute ischemic heart disease, unspecified: Secondary | ICD-10-CM

## 2019-09-25 DIAGNOSIS — I110 Hypertensive heart disease with heart failure: Secondary | ICD-10-CM | POA: Diagnosis present

## 2019-09-25 DIAGNOSIS — G20 Parkinson's disease: Secondary | ICD-10-CM

## 2019-09-25 DIAGNOSIS — I10 Essential (primary) hypertension: Secondary | ICD-10-CM | POA: Diagnosis not present

## 2019-09-25 DIAGNOSIS — R001 Bradycardia, unspecified: Secondary | ICD-10-CM

## 2019-09-25 DIAGNOSIS — Z955 Presence of coronary angioplasty implant and graft: Secondary | ICD-10-CM

## 2019-09-25 DIAGNOSIS — Z7901 Long term (current) use of anticoagulants: Secondary | ICD-10-CM | POA: Diagnosis not present

## 2019-09-25 DIAGNOSIS — Z79899 Other long term (current) drug therapy: Secondary | ICD-10-CM

## 2019-09-25 DIAGNOSIS — G20A1 Parkinson's disease without dyskinesia, without mention of fluctuations: Secondary | ICD-10-CM

## 2019-09-25 DIAGNOSIS — J9811 Atelectasis: Secondary | ICD-10-CM | POA: Diagnosis not present

## 2019-09-25 DIAGNOSIS — E785 Hyperlipidemia, unspecified: Secondary | ICD-10-CM | POA: Diagnosis present

## 2019-09-25 DIAGNOSIS — Z86718 Personal history of other venous thrombosis and embolism: Secondary | ICD-10-CM

## 2019-09-25 DIAGNOSIS — I82409 Acute embolism and thrombosis of unspecified deep veins of unspecified lower extremity: Secondary | ICD-10-CM

## 2019-09-25 DIAGNOSIS — I2510 Atherosclerotic heart disease of native coronary artery without angina pectoris: Secondary | ICD-10-CM | POA: Diagnosis not present

## 2019-09-25 DIAGNOSIS — R079 Chest pain, unspecified: Secondary | ICD-10-CM | POA: Diagnosis not present

## 2019-09-25 DIAGNOSIS — K2270 Barrett's esophagus without dysplasia: Secondary | ICD-10-CM | POA: Diagnosis not present

## 2019-09-25 DIAGNOSIS — Z20822 Contact with and (suspected) exposure to covid-19: Secondary | ICD-10-CM | POA: Diagnosis present

## 2019-09-25 DIAGNOSIS — I214 Non-ST elevation (NSTEMI) myocardial infarction: Principal | ICD-10-CM | POA: Diagnosis present

## 2019-09-25 DIAGNOSIS — I252 Old myocardial infarction: Secondary | ICD-10-CM | POA: Diagnosis not present

## 2019-09-25 DIAGNOSIS — I509 Heart failure, unspecified: Secondary | ICD-10-CM | POA: Diagnosis not present

## 2019-09-25 DIAGNOSIS — I5020 Unspecified systolic (congestive) heart failure: Secondary | ICD-10-CM | POA: Diagnosis not present

## 2019-09-25 DIAGNOSIS — Z8673 Personal history of transient ischemic attack (TIA), and cerebral infarction without residual deficits: Secondary | ICD-10-CM

## 2019-09-25 DIAGNOSIS — I351 Nonrheumatic aortic (valve) insufficiency: Secondary | ICD-10-CM | POA: Diagnosis not present

## 2019-09-25 HISTORY — DX: Non-ST elevation (NSTEMI) myocardial infarction: I21.4

## 2019-09-25 HISTORY — DX: Acute ischemic heart disease, unspecified: I24.9

## 2019-09-25 LAB — TROPONIN I (HIGH SENSITIVITY)
Troponin I (High Sensitivity): 83 ng/L — ABNORMAL HIGH (ref <18)
Troponin I (High Sensitivity): 103 ng/L (ref <18)

## 2019-09-25 LAB — CBC
HCT: 43.4 % (ref 39.0–52.0)
Hemoglobin: 14.9 g/dL (ref 13.0–17.0)
MCH: 32 pg (ref 26.0–34.0)
MCHC: 34.3 g/dL (ref 30.0–36.0)
MCV: 93.3 fL (ref 80.0–100.0)
Platelets: 213 10*3/uL (ref 150–400)
RBC: 4.65 MIL/uL (ref 4.22–5.81)
RDW: 13 % (ref 11.5–15.5)
WBC: 6.6 10*3/uL (ref 4.0–10.5)
nRBC: 0 % (ref 0.0–0.2)

## 2019-09-25 LAB — SARS CORONAVIRUS 2 BY RT PCR (HOSPITAL ORDER, PERFORMED IN ~~LOC~~ HOSPITAL LAB): SARS Coronavirus 2: NEGATIVE

## 2019-09-25 LAB — BASIC METABOLIC PANEL
Anion gap: 8 (ref 5–15)
BUN: 21 mg/dL (ref 8–23)
CO2: 23 mmol/L (ref 22–32)
Calcium: 8.5 mg/dL — ABNORMAL LOW (ref 8.9–10.3)
Chloride: 106 mmol/L (ref 98–111)
Creatinine, Ser: 1 mg/dL (ref 0.61–1.24)
GFR calc Af Amer: >60 mL/min (ref >60)
GFR calc non Af Amer: >60 mL/min (ref >60)
Glucose, Bld: 112 mg/dL — ABNORMAL HIGH (ref 70–99)
Potassium: 3.9 mmol/L (ref 3.5–5.1)
Sodium: 137 mmol/L (ref 135–145)

## 2019-09-25 MED ORDER — NITROGLYCERIN 0.4 MG SL SUBL
0.4000 mg | SUBLINGUAL_TABLET | SUBLINGUAL | Status: DC | PRN
  Administered 2019-09-26: 0.4 mg via SUBLINGUAL
  Filled 2019-09-25: qty 1

## 2019-09-25 MED ORDER — HEPARIN BOLUS VIA INFUSION
4000.0000 [IU] | Freq: Once | INTRAVENOUS | Status: AC
  Administered 2019-09-25: 4000 [IU] via INTRAVENOUS

## 2019-09-25 MED ORDER — CARBIDOPA-LEVODOPA 25-100 MG PO TABS
1.0000 | ORAL_TABLET | Freq: Three times a day (TID) | ORAL | Status: DC
  Administered 2019-09-25 – 2019-09-28 (×8): 1 via ORAL
  Filled 2019-09-25 (×9): qty 1

## 2019-09-25 MED ORDER — ZOLPIDEM TARTRATE 5 MG PO TABS
5.0000 mg | ORAL_TABLET | Freq: Every evening | ORAL | Status: DC | PRN
  Administered 2019-09-26: 5 mg via ORAL
  Filled 2019-09-25: qty 1

## 2019-09-25 MED ORDER — GABAPENTIN 300 MG PO CAPS
600.0000 mg | ORAL_CAPSULE | Freq: Every day | ORAL | Status: DC
  Administered 2019-09-25 – 2019-09-27 (×3): 600 mg via ORAL
  Filled 2019-09-25 (×3): qty 2

## 2019-09-25 MED ORDER — ALPRAZOLAM 0.5 MG PO TABS
1.0000 mg | ORAL_TABLET | Freq: Every day | ORAL | Status: DC | PRN
  Administered 2019-09-27: 1 mg via ORAL
  Filled 2019-09-25: qty 2

## 2019-09-25 MED ORDER — ASPIRIN EC 81 MG PO TBEC
81.0000 mg | DELAYED_RELEASE_TABLET | Freq: Every day | ORAL | Status: DC
  Administered 2019-09-26 – 2019-09-28 (×3): 81 mg via ORAL
  Filled 2019-09-25 (×3): qty 1

## 2019-09-25 MED ORDER — ASPIRIN 81 MG PO CHEW
324.0000 mg | CHEWABLE_TABLET | Freq: Once | ORAL | Status: AC
  Administered 2019-09-25: 324 mg via ORAL
  Filled 2019-09-25: qty 4

## 2019-09-25 MED ORDER — GABAPENTIN 300 MG PO CAPS
300.0000 mg | ORAL_CAPSULE | Freq: Every day | ORAL | Status: DC
  Administered 2019-09-26 – 2019-09-28 (×3): 300 mg via ORAL
  Filled 2019-09-25 (×3): qty 1

## 2019-09-25 MED ORDER — HEPARIN (PORCINE) 25000 UT/250ML-% IV SOLN
1050.0000 [IU]/h | INTRAVENOUS | Status: DC
  Administered 2019-09-25 – 2019-09-26 (×2): 1050 [IU]/h via INTRAVENOUS
  Filled 2019-09-25 (×2): qty 250

## 2019-09-25 MED ORDER — HYDROCHLOROTHIAZIDE 25 MG PO TABS
25.0000 mg | ORAL_TABLET | Freq: Every day | ORAL | Status: DC
  Administered 2019-09-26 – 2019-09-28 (×3): 25 mg via ORAL
  Filled 2019-09-25 (×3): qty 1

## 2019-09-25 MED ORDER — ACETAMINOPHEN 500 MG PO TABS
500.0000 mg | ORAL_TABLET | Freq: Once | ORAL | Status: AC
  Administered 2019-09-25: 500 mg via ORAL
  Filled 2019-09-25: qty 1

## 2019-09-25 MED ORDER — PROPRANOLOL HCL 20 MG PO TABS
20.0000 mg | ORAL_TABLET | Freq: Two times a day (BID) | ORAL | Status: DC
  Administered 2019-09-25: 20 mg via ORAL
  Filled 2019-09-25 (×2): qty 1

## 2019-09-25 MED ORDER — IRBESARTAN 150 MG PO TABS
300.0000 mg | ORAL_TABLET | Freq: Every day | ORAL | Status: DC
  Administered 2019-09-26 – 2019-09-28 (×3): 300 mg via ORAL
  Filled 2019-09-25 (×3): qty 2

## 2019-09-25 MED ORDER — VALSARTAN-HYDROCHLOROTHIAZIDE 320-25 MG PO TABS: 1.0000 | ORAL_TABLET | Freq: Every day | ORAL | Status: DC

## 2019-09-25 MED ORDER — ATORVASTATIN CALCIUM 80 MG PO TABS
80.0000 mg | ORAL_TABLET | Freq: Every day | ORAL | Status: DC
  Administered 2019-09-25: 80 mg via ORAL
  Filled 2019-09-25 (×2): qty 1

## 2019-09-25 MED ORDER — PANTOPRAZOLE SODIUM 40 MG PO TBEC
40.0000 mg | DELAYED_RELEASE_TABLET | Freq: Two times a day (BID) | ORAL | Status: DC
  Administered 2019-09-25 – 2019-09-28 (×6): 40 mg via ORAL
  Filled 2019-09-25 (×6): qty 1

## 2019-09-25 MED ORDER — AMLODIPINE BESYLATE 2.5 MG PO TABS
2.5000 mg | ORAL_TABLET | Freq: Every day | ORAL | Status: DC
  Administered 2019-09-26 – 2019-09-28 (×3): 2.5 mg via ORAL
  Filled 2019-09-25 (×3): qty 1

## 2019-09-25 MED ORDER — ONDANSETRON HCL 4 MG/2ML IJ SOLN: 4.0000 mg | Freq: Four times a day (QID) | INTRAMUSCULAR | Status: DC | PRN

## 2019-09-25 MED ORDER — MORPHINE SULFATE (PF) 4 MG/ML IV SOLN
4.0000 mg | Freq: Once | INTRAVENOUS | Status: AC
  Administered 2019-09-25: 4 mg via INTRAVENOUS
  Filled 2019-09-25: qty 1

## 2019-09-25 MED ORDER — ACETAMINOPHEN 325 MG PO TABS: 650.0000 mg | ORAL_TABLET | ORAL | Status: DC | PRN

## 2019-09-25 MED ORDER — ATORVASTATIN CALCIUM 10 MG PO TABS: 20.0000 mg | ORAL_TABLET | Freq: Every day | ORAL | Status: DC

## 2019-09-25 MED ORDER — BUPROPION HCL ER (XL) 150 MG PO TB24
300.0000 mg | ORAL_TABLET | Freq: Every day | ORAL | Status: DC
  Administered 2019-09-26 – 2019-09-28 (×3): 300 mg via ORAL
  Filled 2019-09-25 (×3): qty 2

## 2019-09-25 NOTE — ED Notes (Signed)
Date and time results received: 09/25/19 7:30 PM  (use smartphrase ".now" to insert current time)  Test: Troponin Critical Value: 103  Name of Provider Notified: Dr. Fredderick Phenix and primary RN Raynelle Fanning  Orders Received? Or Actions Taken?: no new orders

## 2019-09-25 NOTE — H&P (Signed)
Cardiology Admission History and Physical:   Patient ID: [name]. MRN: [id number]; DOB: 1945-07-20   Admission date: 09/25/2019  Primary Care Provider: Loyal Jacobson, MD Primary Cardiologist: No primary care provider on file.  Primary Electrophysiologist:  None   Chief Complaint:  Chest pain  Patient Profile:   Christian Ou. is a 74 y.o. male with history of Parkinson's disease, DVT (on pradaxa), stroke, CAD s/p MI w/ PCI in 2002, HTN, DM who presents with chest pain.   History of Present Illness:   [name] reports symptoms of chest pain beginning spontaneously earlier today. He does not regularly get chest pain, but does occasionally have some pains. This pain however was much worse than what he has had previously. It began while he was at rest and was located in the central chest with radiation to the neck and jaw. He took 1 dose of SLN which did improve his symptoms somewhat. The pain however persisted thus he came to the [facility] ED for workup.   In the ED, the patient was given IV morphine and his pain resolved. He was found to be slightly hypertensive and bradycardic on arrival. Otherwise the remainder of his vital signs were within normal limits. His ECG revealed no acute ischemic changes. An initial troponin was found to be elevated to 83. He was given full dose aspirin and started on a heparin drip. He was transferred to [facility] for further management.   On arrival to [facility], the patient was chest pain free and without complaints. He states he has no history of smoking. He drinks 2 alcoholic beverages daily. He uses no illicit drugs.   Heart Pathway Score:     Past Medical History:  Diagnosis Date  . Coronary artery disease   . Diabetes mellitus   . DVT (deep venous thrombosis) (HCC)   . Hypertension   . Myocardial infarction (HCC)   . Parkinson's disease (HCC)   . Stroke [facility])     Past Surgical History:  Procedure Laterality  Date  . CORONARY ANGIOPLASTY WITH STENT PLACEMENT       Medications Prior to Admission: Prior to Admission medications   Medication Sig Start Date End Date Taking? Authorizing Provider  ALPRAZolam Prudy Feeler) 1 MG tablet Take 1 mg by mouth 3 (three) times daily as needed for anxiety.    [provider]  amLODipine (NORVASC) 2.5 MG tablet Take 2.5 mg by mouth daily.    [provider]  aspirin 81 MG tablet Take 81 mg by mouth daily.    [provider]  atorvastatin (LIPITOR) 20 MG tablet Take 20 mg by mouth daily.    [provider]  buPROPion (WELLBUTRIN XL) 300 MG 24 hr tablet Take 300 mg by mouth daily.    [provider]  CARBIDOPA-LEVODOPA EN by Enteral route.    [provider]  dabigatran (PRADAXA) 150 MG CAPS capsule Take 150 mg by mouth 2 (two) times daily.    [provider]  dextromethorphan-guaiFENesin (MUCINEX DM) 30-600 MG 12hr tablet Take 1 tablet by mouth 2 (two) times daily. 07/31/16   Vanetta Mulders, MD  oxycodone (OXY-IR) 5 MG capsule Take 5 mg by mouth every 4 (four) hours as needed.    [provider]  pantoprazole (PROTONIX) 40 MG tablet Take 40 mg by mouth daily.    [provider]  valsartan-hydrochlorothiazide (DIOVAN-HCT) 320-25 MG per tablet Take 1 tablet by mouth daily.  [provider]  zolpidem (AMBIEN) 10 MG tablet Take 10 mg by mouth at bedtime as needed for sleep.    [provider]     Allergies:    Allergies  Allergen Reactions  . Lisinopril Cough    Social History:   Social History   Socioeconomic History  . Marital status: Married    Spouse name: Not on file  . Number of children: Not on file  . Years of education: Not on file  . Highest education level: Not on file  Occupational History  . Not on file  Tobacco Use  . Smoking status: Never Smoker  . Smokeless tobacco: Never Used  Substance and Sexual Activity  . Alcohol use: Yes    Comment:  daily  . Drug use: No  . Sexual activity: Not on file  Other Topics Concern  . Not on file  Social History Narrative  . Not on file   Social Determinants of Health   Financial Resource Strain:   . Difficulty of Paying Living Expenses:   Food Insecurity:   . Worried About Charity fundraiser in the Last Year:   . Arboriculturist in the Last Year:   Transportation Needs:   . Film/video editor (Medical):   Marland Kitchen Lack of Transportation (Non-Medical):   Physical Activity:   . Days of Exercise per Week:   . Minutes of Exercise per Session:   Stress:   . Feeling of Stress :   Social Connections:   . Frequency of Communication with Friends and Family:   . Frequency of Social Gatherings with Friends and Family:   . Attends Religious Services:   . Active Member of Clubs or Organizations:   . Attends Archivist Meetings:   Marland Kitchen Marital Status:   Intimate Partner Violence:   . Fear of Current or Ex-Partner:   . Emotionally Abused:   Marland Kitchen Physically Abused:   . Sexually Abused:     Family History:   The patient's family history is not on file.    ROS:  Please see the history of present illness.  All other ROS reviewed and negative.     Physical Exam/Data:   Vitals:   09/25/19 1937 09/25/19 2007 09/25/19 2109 09/25/19 2118  BP: (!) 154/105 (!) 156/100  (!) 144/97  Pulse: (!) 53 64  (!) 51  Resp: (!) 21 (!) 22  18  Temp: 98.4 F (36.9 C)   98.1 F (36.7 C)  TempSrc: Oral   Oral  SpO2: 98% 94%  98%  Weight:   98.3 kg   Height:   5\' 8"  (1.727 m)    No intake or output data in the 24 hours ending 09/25/19 2242 Last 3 Weights 09/25/2019 09/25/2019 10/11/2017  Weight (lbs) 216 lb 11.2 oz 209 lb 200 lb  Weight (kg) 98.294 kg 94.802 kg 90.719 kg     Body mass index is 32.95 kg/m.  General:  Well nourished, well developed, in no acute distress HEENT: normal Lymph: no adenopathy Neck: no JVD Endocrine:  No thryomegaly Vascular: No carotid bruits; FA pulses 2+ bilaterally  without bruits  Cardiac:  normal S1, S2; RRR; 2/6 late systolic murmur best appreciated at the upper sternal border Lungs:  clear to auscultation bilaterally, no wheezing, rhonchi or rales  Abd: soft, nontender, no hepatomegaly  Ext:  1+ pitting edema in the bilateral legs below the knee Musculoskeletal:  No deformities, BUE and BLE strength normal and equal Skin:  warm and dry  Neuro:  CNs 2-12 intact, no focal abnormalities noted Psych:  Normal affect    EKG:  The ECG that was done on arrival to the ED was personally reviewed and demonstrates NSR, left axis deviation, anterior Q waves, no overt ST or T wave changes suggestive of ischemia  Relevant CV Studies: None in the computer system  Laboratory Data:  High Sensitivity Troponin:   Recent Labs  Lab 09/25/19 1625 09/25/19 1819  TROPONINIHS 83* 103*      Chemistry Recent Labs  Lab 09/25/19 1625  NA 137  K 3.9  CL 106  CO2 23  GLUCOSE 112*  BUN 21  CREATININE 1.00  CALCIUM 8.5*  GFRNONAA >60  GFRAA >60  ANIONGAP 8    No results for input(s): PROT, ALBUMIN, AST, ALT, ALKPHOS, BILITOT in the last 168 hours. Hematology Recent Labs  Lab 09/25/19 1625  WBC 6.6  RBC 4.65  HGB 14.9  HCT 43.4  MCV 93.3  MCH 32.0  MCHC 34.3  RDW 13.0  PLT 213   BNPNo results for input(s): BNP, PROBNP in the last 168 hours.  DDimer No results for input(s): DDIMER in the last 168 hours.   Radiology/Studies:  DG Chest 2 View  Result Date: 09/25/2019 CLINICAL DATA:  Chest pain x2 hours. EXAM: CHEST - 2 VIEW COMPARISON:  January 20, 2017 FINDINGS: Mild atelectasis is seen within the right lung base and mid left lung. There is no evidence of a pleural effusion or pneumothorax. The heart size and mediastinal contours are within normal limits. There is tortuosity of the descending thoracic aorta. Degenerative changes seen throughout the thoracic spine. IMPRESSION: Mild right basilar and mid left lung atelectasis. Electronically Signed    By: Aram Candela M.D.   On: 09/25/2019 17:29       TIMI Risk Score for Unstable Angina or Non-ST Elevation MI:   The patient's TIMI risk score is 5, which indicates a 26% risk of all cause mortality, new or recurrent myocardial infarction or need for urgent revascularization in the next 14 days.   Assessment and Plan:  Christian Lavell. is a 74 y.o. male with history of Parkinson's disease, DVT (on pradaxa), stroke, CAD s/p MI w/ PCI in 2002, HTN, DM who presents with chest pain, found to have NSTEMI.  #) NSTEMI: story highly suggestive of type I ACS. No other signs/symptoms to suggest PE or acute aortic syndrome. Patient now chest pain free Diagnostics: - echo in AM - check lipids, A1c  Therapeutics: - cath on monday - ASA 324mg  then 81mg  daily - heparin drip for ACS per pharmacy protocol - increase atorvastatin to 80mg  QHS - continue home propranolol (which he takes for parkinson's disease) - start ACE in AM - SLN, nitro gtt PRN - defer P2Y12 until after cath - cardiac rehab  #) DM: controlled without medications - monitor blood glucose - check A1c - may benefit from SGLT2 inhibitor in setting of DM and CAD  #) Parkinson's disease - cont home Sinemet, proprolol as per above  #) History of DVT, stroke - hold home pradaxa while on heparin - will need to decide on course for triple therapy if patient ends up undergoing PCI  #) HTN:  - cont diovan, amlodipine - plan to add ACE/ARB as per above  #) Home meds - cont gabapentin, protonix, wellbutrin  Severity of Illness: The appropriate patient status for this patient is INPATIENT. Inpatient status is judged to be reasonable and  necessary in order to provide the required intensity of service to ensure the patient's safety. The patient's presenting symptoms, physical exam findings, and initial radiographic and laboratory data in the context of their chronic comorbidities is felt to place them at high risk for further  clinical deterioration. Furthermore, it is not anticipated that the patient will be medically stable for discharge from the hospital within 2 midnights of admission. The following factors support the patient status of inpatient.   " The patient's presenting symptoms include chest pain. " The worrisome physical exam findings include systolic murmur, leg edema. " The initial radiographic and laboratory data are worrisome because of Q waves on ECG. " The chronic co-morbidities include HTN, DM.   * I certify that at the point of admission it is my clinical judgment that the patient will require inpatient hospital care spanning beyond 2 midnights from the point of admission due to high intensity of service, high risk for further deterioration and high frequency of surveillance required.*    For questions or updates, please contact [facility] Please consult [email or web address] for contact info under        Signed, Rosario Jacks, MD  09/25/2019 10:42 PM

## 2019-09-25 NOTE — ED Notes (Signed)
Dr. Liane Comber notified of critical troponin level of 103

## 2019-09-25 NOTE — ED Triage Notes (Addendum)
Chest pain x 2 hours, radiates into the jaw. He experienced SOB earlier.

## 2019-09-25 NOTE — Progress Notes (Signed)
ANTICOAGULATION CONSULT NOTE  Pharmacy Consult for Heparin Indication: chest pain/ACS  Allergies  Allergen Reactions  . Lisinopril Cough    Patient Measurements: Height: 5\' 8"  (172.7 cm) Weight: 94.8 kg (209 lb) IBW/kg (Calculated) : 68.4 Heparin Dosing Weight: 88.3 kg  Vital Signs: Temp: 98.7 F (37.1 C) (06/05 1612) Temp Source: Oral (06/05 1612) BP: 149/89 (06/05 1800) Pulse Rate: 55 (06/05 1800)  Labs: Recent Labs    09/25/19 1625  HGB 14.9  HCT 43.4  PLT 213  CREATININE 1.00  TROPONINIHS 83*    Estimated Creatinine Clearance: 72.4 mL/min (by C-G formula based on SCr of 1 mg/dL).   Medical History: Past Medical History:  Diagnosis Date  . Coronary artery disease   . Diabetes mellitus   . DVT (deep venous thrombosis) (HCC)   . Hypertension   . Myocardial infarction (HCC)   . Parkinson's disease (HCC)   . Stroke [facility])     Medications:  Scheduled:  . heparin  4,000 Units Intravenous Once    Assessment: Patient is a 61 yom that presented to the ED with CP and SOB. The patient was found to have an elevated trop and pharmacy has been asked to dose heparin at this time.   Goal of Therapy:  Heparin level 0.3-0.7 units/ml Monitor platelets by anticoagulation protocol: Yes   Plan:  - Heparin bolus 4000 units  IV x 1 dose - Heparin drip @ 1050 units/hr - Heparin level in ~ 6 hours  - Monitor patient for s/s of bleeding and CBC while on heparin   66 PharmD. BCPS  09/25/2019,6:37 PM

## 2019-09-25 NOTE — ED Provider Notes (Signed)
[facility] EMERGENCY DEPARTMENT Provider Note   CSN: [id number] Arrival date & time: 09/25/19  1602     History Chief Complaint  Patient presents with  . Chest Pain    [name]. is a 74 y.o. male history of CAD/MI, diabetes, hypertension, Parkinson's, CVA, Barrett's esophagus.  Patient reports [name] takes Pradaxa 150 mg twice daily with last dose this morning 6 AM.  Patient reports chest pain onset around 2 hours prior to arrival describes a severe sharp pressure in the center of his chest rating up to his jaw, constant.  [name] took 3 nitro at home without improvement of his symptoms.  [name] reports pain feels very similar to MI [name] experienced in the early 2000's.  Reports mild nausea.  Reports [name] felt somewhat short of breath while driving over to the ER which resolved, [name] feels this is secondary to chest pain.  Denies recent illness, fever/chills, vomiting, abdominal pain, extremity swelling/color change, fall/injury or any additional concerns.  Of note patient reports that [name] has received both Moderna Covid vaccines.  Patient reports last heart cath was in the early 2000's.  HPI     Past Medical History:  Diagnosis Date  . Coronary artery disease   . Diabetes mellitus   . DVT (deep venous thrombosis) (HCC)   . Hypertension   . Myocardial infarction (HCC)   . Parkinson's disease (HCC)   . Stroke [facility])     Patient Active Problem List   Diagnosis Date Noted  . ACS (acute coronary syndrome) (HCC) 09/25/2019    Past Surgical History:  Procedure Laterality Date  . CORONARY ANGIOPLASTY WITH STENT PLACEMENT         No family history on file.  Social History   Tobacco Use  . Smoking status: Never Smoker  . Smokeless tobacco: Never Used  Substance Use Topics  . Alcohol use: Yes    Comment: daily  . Drug use: No    Home Medications Prior to Admission medications   Medication Sig Start Date End Date Taking? Authorizing Provider  ALPRAZolam Prudy Feeler) 1 MG  tablet Take 1 mg by mouth 3 (three) times daily as needed for anxiety.    [provider]  amLODipine (NORVASC) 2.5 MG tablet Take 2.5 mg by mouth daily.    [provider]  aspirin 81 MG tablet Take 81 mg by mouth daily.    [provider]  atorvastatin (LIPITOR) 20 MG tablet Take 20 mg by mouth daily.    [provider]  buPROPion (WELLBUTRIN XL) 300 MG 24 hr tablet Take 300 mg by mouth daily.    [provider]  CARBIDOPA-LEVODOPA EN by Enteral route.    [provider]  dabigatran (PRADAXA) 150 MG CAPS capsule Take 150 mg by mouth 2 (two) times daily.    [provider]  dextromethorphan-guaiFENesin (MUCINEX DM) 30-600 MG 12hr tablet Take 1 tablet by mouth 2 (two) times daily. 07/31/16   Vanetta Mulders, MD  oxycodone (OXY-IR) 5 MG capsule Take 5 mg by mouth every 4 (four) hours as needed.    [provider]  pantoprazole (PROTONIX) 40 MG tablet Take 40 mg by mouth daily.    [provider]  valsartan-hydrochlorothiazide (DIOVAN-HCT) 320-25 MG per tablet Take 1 tablet by mouth daily.    [provider]  zolpidem (AMBIEN) 10 MG tablet Take 10 mg by mouth at bedtime as needed for sleep.    [provider]    Allergies  Lisinopril  Review of Systems   Review of Systems Ten systems are reviewed and are negative for acute change except as noted in the HPI   Physical Exam Updated Vital Signs BP (!) 149/89   Pulse (!) 55   Temp 98.7 F (37.1 C) (Oral)   Resp 11   Ht 5\' 8"  (1.727 m)   Wt 94.8 kg   SpO2 97%   BMI 31.78 kg/m   Physical Exam Constitutional:      General: [name] is not in acute distress.    Appearance: Normal appearance. [name] is well-developed. [name] is not ill-appearing or diaphoretic.  HENT:     Head: Normocephalic and atraumatic.  Eyes:     General: Vision grossly intact. Gaze aligned appropriately.     Pupils: Pupils are equal, round, and reactive to light.  Neck:      Trachea: Trachea and phonation normal.  Cardiovascular:     Rate and Rhythm: Normal rate and regular rhythm.     Pulses:          Dorsalis pedis pulses are 2+ on the right side and 2+ on the left side.  Pulmonary:     Effort: Pulmonary effort is normal. No respiratory distress.     Breath sounds: Normal breath sounds.  Abdominal:     General: There is no distension.     Palpations: Abdomen is soft.     Tenderness: There is no abdominal tenderness. There is no guarding or rebound.  Musculoskeletal:        General: Normal range of motion.     Cervical back: Normal range of motion.     Right lower leg: No tenderness. No edema.     Left lower leg: No tenderness. No edema.  Skin:    General: Skin is warm and dry.  Neurological:     Mental Status: [name] is alert.     GCS: GCS eye subscore is 4. GCS verbal subscore is 5. GCS motor subscore is 6.     Comments: Speech is clear and goal oriented, follows commands Major Cranial nerves without deficit, no facial droop Moves extremities without ataxia, coordination intact  Psychiatric:        Behavior: Behavior normal.     ED Results / Procedures / Treatments   Labs (all labs ordered are listed, but only abnormal results are displayed) Labs Reviewed  BASIC METABOLIC PANEL - Abnormal; Notable for the following components:      Result Value   Glucose, Bld 112 (*)    Calcium 8.5 (*)    All other components within normal limits  TROPONIN I (HIGH SENSITIVITY) - Abnormal; Notable for the following components:   Troponin I (High Sensitivity) 83 (*)    All other components within normal limits  SARS CORONAVIRUS 2 BY RT PCR (HOSPITAL ORDER, PERFORMED IN [facility])  CBC  HEPARIN LEVEL (UNFRACTIONATED)  TROPONIN I (HIGH SENSITIVITY)    EKG EKG Interpretation  Date/Time:  Saturday September 25 2019 16:05:50 EDT Ventricular Rate:  69 PR Interval:  214 QRS Duration: 94 QT Interval:  394 QTC Calculation: 422 R Axis:   -38 Text  Interpretation: Sinus rhythm with 1st degree A-V block Left axis deviation Septal infarct , age undetermined Possible Lateral infarct , age undetermined Abnormal ECG since last tracing no significant change Confirmed by 08[birth year]1992 [phone]) on 09/25/2019 4:50:42 PM   Radiology DG Chest 2 View  Result Date: 09/25/2019 CLINICAL DATA:  Chest pain x2 hours. EXAM:  CHEST - 2 VIEW COMPARISON:  January 20, 2017 FINDINGS: Mild atelectasis is seen within the right lung base and mid left lung. There is no evidence of a pleural effusion or pneumothorax. The heart size and mediastinal contours are within normal limits. There is tortuosity of the descending thoracic aorta. Degenerative changes seen throughout the thoracic spine. IMPRESSION: Mild right basilar and mid left lung atelectasis. Electronically Signed   By: Aram Candela M.D.   On: 09/25/2019 17:29    Procedures .Critical Care Performed by: Bill Salinas, PA-C Authorized by: Bill Salinas, PA-C   Critical care provider statement:    Critical care time (minutes):  45   Critical care was necessary to treat or prevent imminent or life-threatening deterioration of the following conditions:  Cardiac failure   Critical care was time spent personally by me on the following activities:  Discussions with consultants, evaluation of patient's response to treatment, examination of patient, ordering and performing treatments and interventions, ordering and review of laboratory studies, ordering and review of radiographic studies, pulse oximetry, re-evaluation of patient's condition, obtaining history from patient or surrogate, review of old charts and development of treatment plan with patient or surrogate   (including critical care time)  Medications Ordered in ED Medications  heparin ADULT infusion 100 units/mL (25000 units/254mL sodium chloride 0.45%) (1,050 Units/hr Intravenous New Bag/Given 09/25/19 1854)  aspirin chewable tablet 324 mg (324 mg  Oral Given 09/25/19 1644)  morphine 4 MG/ML injection 4 mg (4 mg Intravenous Given 09/25/19 1644)  acetaminophen (TYLENOL) tablet 500 mg (500 mg Oral Given 09/25/19 1729)  heparin bolus via infusion 4,000 Units (4,000 Units Intravenous Bolus from Bag 09/25/19 1854)  acetaminophen (TYLENOL) tablet 500 mg (500 mg Oral Given 09/25/19 1847)    ED Course  I have reviewed the triage vital signs and the nursing notes.  Pertinent labs & imaging results that were available during my care of the patient were reviewed by me and considered in my medical decision making (see chart for details).  Clinical Course as of Sep 24 1904  Sat Sep 25, 2019  Marylu Lund   [BM]  1733 Dr. Santiago Glad   [BM]    Clinical Course User Index [BM] Elizabeth Palau   MDM Rules/Calculators/A&P                     Additional History Obtained: 1. Nursing notes from this visit. 2. Patient's family member at bedside. - I ordered, reviewed and interpreted labs which include: CBC within normal limits, no leukocytosis to suggest infection, no evidence of anemia BMP shows no emergent lecture derangement or evidence of acute kidney injury, no gap High-sensitivity troponin of 83  EKG: Sinus rhythm with 1st degree A-V block Left axis deviation Septal infarct , age undetermined Possible Lateral infarct , age undetermined Abnormal ECG since last tracing no significant change Confirmed by Rolan Bucco [phone]) on 09/25/2019 4:50:42 PM   CXR:    IMPRESSION:  Mild right basilar and mid left lung atelectasis.  I personally reviewed patient's chest x-ray and agree with radiology interpretation.  Discussed case with Dr. Fredderick Phenix, concern for ACS and possible NSTEMI at this time.  Given patient has no tachycardia, hypoxia, hypotension or evidence of DVT doubt pulmonary embolism as etiology of symptoms today, additionally [name] reports compliance with Pradaxa.  Additionally history and physical examination does not appear consistent with a  dissection.  Will consult cardiology. - Patient's cardiologist is at [facility], at  5:27 PM I had a conversation with nursing administrator Benjie Karvonen at [facility].  She advised that they had no available beds at [facility], they had a wait list of at least 5 and no potential discharges today.  They would not be able to accept patient for an unknown amount of time. - Discussed case with cardiologist Dr. Emilio Aspen, advised that patient may be started on heparin when his next dose of Pradaxa was due.  Cardiology will accept patient when there is available room at [facility]. - Patient was reevaluated [name] is resting comfortably no acute distress.  [name] reports chest pain resolved after morphine however [name] has not developed a headache.  [name] was given 500 mg Tylenol with minimal improvement [name] reports that [name] normally has to take 1000 mg of Tylenol to help with headache, [name] reports that [name] never takes more than that a day.  [name] has not had any additional Tylenol today, a second 500 mg Tylenol was ordered to help with his headache.  Additionally patient reports that [name] had first dose of Pradaxa at 6 AM, normally takes second dose in the late evening, before bed.  Heparin ordered per pharmacy.  Patient was also given full dose aspirin on arrival as [name] had none prior. - Patient reassessment [name] is resting comfortably no acute distress vital signs stable.  They are agreeable to eventual transfer to [facility].  Temporary admission orders placed, rediscussed case with Dr. Tamera Punt who agrees with plan.   Note: Portions of this report may have been transcribed using voice recognition software. Every effort was made to ensure accuracy; however, inadvertent computerized transcription errors may still be present. Final Clinical Impression(s) / ED Diagnoses Final diagnoses:  ACS (acute coronary syndrome) [facility])    Rx / DC Orders ED Discharge Orders    None       Gari Crown 09/25/19 1906    Malvin Johns, MD 09/25/19 2328

## 2019-09-26 ENCOUNTER — Inpatient Hospital Stay (HOSPITAL_COMMUNITY): Payer: Medicare Other

## 2019-09-26 ENCOUNTER — Other Ambulatory Visit (HOSPITAL_COMMUNITY): Payer: Medicare Other

## 2019-09-26 DIAGNOSIS — I351 Nonrheumatic aortic (valve) insufficiency: Secondary | ICD-10-CM

## 2019-09-26 DIAGNOSIS — I340 Nonrheumatic mitral (valve) insufficiency: Secondary | ICD-10-CM

## 2019-09-26 DIAGNOSIS — I214 Non-ST elevation (NSTEMI) myocardial infarction: Principal | ICD-10-CM

## 2019-09-26 LAB — CBC
HCT: 42.2 % (ref 39.0–52.0)
Hemoglobin: 14.2 g/dL (ref 13.0–17.0)
MCH: 31.6 pg (ref 26.0–34.0)
MCHC: 33.6 g/dL (ref 30.0–36.0)
MCV: 94 fL (ref 80.0–100.0)
Platelets: 173 10*3/uL (ref 150–400)
RBC: 4.49 MIL/uL (ref 4.22–5.81)
RDW: 12.9 % (ref 11.5–15.5)
WBC: 5.3 10*3/uL (ref 4.0–10.5)
nRBC: 0 % (ref 0.0–0.2)

## 2019-09-26 LAB — BASIC METABOLIC PANEL
Anion gap: 10 (ref 5–15)
BUN: 13 mg/dL (ref 8–23)
CO2: 22 mmol/L (ref 22–32)
Calcium: 8.5 mg/dL — ABNORMAL LOW (ref 8.9–10.3)
Chloride: 105 mmol/L (ref 98–111)
Creatinine, Ser: 0.92 mg/dL (ref 0.61–1.24)
GFR calc Af Amer: >60 mL/min (ref >60)
GFR calc non Af Amer: >60 mL/min (ref >60)
Glucose, Bld: 106 mg/dL — ABNORMAL HIGH (ref 70–99)
Potassium: 4 mmol/L (ref 3.5–5.1)
Sodium: 137 mmol/L (ref 135–145)

## 2019-09-26 LAB — HEMOGLOBIN A1C
Hgb A1c MFr Bld: 6 % — ABNORMAL HIGH (ref 4.8–5.6)
Mean Plasma Glucose: 125.5 mg/dL

## 2019-09-26 LAB — APTT: aPTT: 76 seconds — ABNORMAL HIGH (ref 24–36)

## 2019-09-26 LAB — LIPID PANEL
Cholesterol: 143 mg/dL (ref 0–200)
HDL: 62 mg/dL (ref >40)
LDL Cholesterol: 68 mg/dL (ref 0–99)
Total CHOL/HDL Ratio: 2.3 RATIO
Triglycerides: 63 mg/dL (ref <150)
VLDL: 13 mg/dL (ref 0–40)

## 2019-09-26 LAB — ECHOCARDIOGRAM COMPLETE
Height: 68 in
Weight: 3467.18 oz

## 2019-09-26 LAB — HEPARIN LEVEL (UNFRACTIONATED)
Heparin Unfractionated: 0.4 IU/mL (ref 0.30–0.70)
Heparin Unfractionated: 0.34 IU/mL (ref 0.30–0.70)

## 2019-09-26 LAB — TROPONIN I (HIGH SENSITIVITY): Troponin I (High Sensitivity): 530 ng/L (ref <18)

## 2019-09-26 MED ORDER — ATORVASTATIN CALCIUM 80 MG PO TABS
80.0000 mg | ORAL_TABLET | Freq: Every day | ORAL | Status: DC
  Administered 2019-09-26 – 2019-09-28 (×3): 80 mg via ORAL
  Filled 2019-09-26 (×4): qty 1

## 2019-09-26 MED ORDER — SODIUM CHLORIDE 0.9 % WEIGHT BASED INFUSION
3.0000 mL/kg/h | INTRAVENOUS | Status: DC
  Administered 2019-09-27: 3 mL/kg/h via INTRAVENOUS

## 2019-09-26 MED ORDER — MORPHINE SULFATE (PF) 2 MG/ML IV SOLN
2.0000 mg | INTRAVENOUS | Status: DC | PRN
  Administered 2019-09-26 – 2019-09-27 (×3): 2 mg via INTRAVENOUS
  Filled 2019-09-26 (×3): qty 1

## 2019-09-26 MED ORDER — LOPERAMIDE HCL 2 MG PO CAPS
2.0000 mg | ORAL_CAPSULE | ORAL | Status: DC | PRN
  Administered 2019-09-26 – 2019-09-27 (×3): 2 mg via ORAL
  Filled 2019-09-26 (×3): qty 1

## 2019-09-26 MED ORDER — SODIUM CHLORIDE 0.9% FLUSH
3.0000 mL | Freq: Two times a day (BID) | INTRAVENOUS | Status: DC
  Administered 2019-09-26 (×2): 3 mL via INTRAVENOUS

## 2019-09-26 MED ORDER — ASPIRIN 81 MG PO CHEW
81.0000 mg | CHEWABLE_TABLET | ORAL | Status: AC
  Administered 2019-09-27: 81 mg via ORAL
  Filled 2019-09-26: qty 1

## 2019-09-26 MED ORDER — SODIUM CHLORIDE 0.9 % IV SOLN: 250.0000 mL | INTRAVENOUS | Status: DC | PRN

## 2019-09-26 MED ORDER — NITROGLYCERIN 2 % TD OINT
0.5000 [in_us] | TOPICAL_OINTMENT | Freq: Four times a day (QID) | TRANSDERMAL | Status: DC
  Filled 2019-09-26: qty 30

## 2019-09-26 MED ORDER — SODIUM CHLORIDE 0.9% FLUSH: 3.0000 mL | INTRAVENOUS | Status: DC | PRN

## 2019-09-26 MED ORDER — PROPRANOLOL HCL 10 MG PO TABS
10.0000 mg | ORAL_TABLET | Freq: Two times a day (BID) | ORAL | Status: DC
  Administered 2019-09-26: 10 mg via ORAL
  Filled 2019-09-26 (×2): qty 1

## 2019-09-26 MED ORDER — SODIUM CHLORIDE 0.9 % WEIGHT BASED INFUSION: 1.0000 mL/kg/h | INTRAVENOUS | Status: DC

## 2019-09-26 NOTE — Plan of Care (Signed)
  Problem: Clinical Measurements: Goal: Ability to maintain clinical measurements within normal limits will improve 09/26/2019 0841 by Daiva Nakayama, RN Outcome: Progressing 09/26/2019 0841 by Daiva Nakayama, RN Outcome: Progressing Goal: Cardiovascular complication will be avoided 09/26/2019 0841 by Daiva Nakayama, RN Outcome: Progressing 09/26/2019 0841 by Daiva Nakayama, RN Outcome: Progressing

## 2019-09-26 NOTE — Progress Notes (Signed)
ANTICOAGULATION CONSULT NOTE  Pharmacy Consult for Heparin Indication: chest pain/ACS  Allergies  Allergen Reactions  . Lisinopril Cough    Patient Measurements: Height: 5\' 8"  (172.7 cm) Weight: 98.3 kg (216 lb 11.2 oz) IBW/kg (Calculated) : 68.4 Heparin Dosing Weight: 88.3 kg  Vital Signs: Temp: 98.1 F (36.7 C) (06/06 0403) Temp Source: Oral (06/06 0403) BP: 131/87 (06/06 0403) Pulse Rate: 51 (06/06 0403)  Labs: Recent Labs    09/25/19 1625 09/25/19 1819 09/26/19 0303  HGB 14.9  --   --   HCT 43.4  --   --   PLT 213  --   --   HEPARINUNFRC  --   --  0.40  CREATININE 1.00  --   --   TROPONINIHS 83* 103*  --     Estimated Creatinine Clearance: 73.7 mL/min (by C-G formula based on SCr of 1 mg/dL).   Assessment: Patient is a 45 yom that presented to the ED with CP and SOB. The patient was found to have an elevated trop and pharmacy has been asked to dose heparin at this time.   Heparin level therapeutic (0.4) on gtt at 1050 units/hr. No bleeding noted. Plan for cath on Monday.  Goal of Therapy:  Heparin level 0.3-0.7 units/ml Monitor platelets by anticoagulation protocol: Yes   Plan:  Continue Heparin drip @ 1050 units/hr Will f/u confirmatory heparin level in 6 hours  Friday, PharmD, BCPS Please see amion for complete clinical pharmacist phone list 09/26/2019,4:14 AM

## 2019-09-26 NOTE — Progress Notes (Signed)
Mild chest pains through the day 2-3/10, improved with IV moprhine. We did try a one time dose of SL NG, patient bp decreased and had some vagal symptoms of nausea, diaphoresis, transient decrease in HR. Symptoms improved now, would avoid nitroglycerin for pain, does better with prn morphine. From chest pain standpoint he is pain free, mild uptrend in trop to 500, EKG this AM sinus brady without acute ischemic changes. We are stopping his propanolol all together due to low HRs. Plan remains for cath tomorrow, if escalation of symptoms would reconsider cath for today.   Dina Rich MD

## 2019-09-26 NOTE — Progress Notes (Signed)
  Echocardiogram 2D Echocardiogram has been performed.  Pieter Partridge 09/26/2019, 2:34 PM

## 2019-09-26 NOTE — Progress Notes (Signed)
Troponin = 530. Called to Dr Wyline Mood

## 2019-09-26 NOTE — Progress Notes (Addendum)
ANTICOAGULATION CONSULT NOTE  Pharmacy Consult for Heparin Indication: chest pain/ACS  Allergies  Allergen Reactions   Lisinopril Cough    Patient Measurements: Height: 5\' 8"  (172.7 cm) Weight: 98.3 kg (216 lb 11.2 oz) IBW/kg (Calculated) : 68.4 Heparin Dosing Weight: 88.3 kg  Vital Signs: Temp: 97.9 F (36.6 C) (06/06 0727) Temp Source: Oral (06/06 0727) BP: 145/93 (06/06 0727) Pulse Rate: 51 (06/06 0727)  Labs: Recent Labs    09/25/19 1625 09/25/19 1819 09/26/19 0303  HGB 14.9  --   --   HCT 43.4  --   --   PLT 213  --   --   HEPARINUNFRC  --   --  0.40  CREATININE 1.00  --  0.92  TROPONINIHS 83* 103*  --     Estimated Creatinine Clearance: 80.1 mL/min (by C-G formula based on SCr of 0.92 mg/dL).   Assessment: Patient is a 77 yom that presented to the ED with CP and SOB. PMH significant for DVT (on pradaxa - last dose 6/5 AM), stroke, CAD s/p MI w/ PCI in 2002, HTN, DM, and Parkinson's disease. The patient was found to have an elevated trop and pharmacy has been asked to dose heparin at this time.  Heparin level remains therapeutic at 0.34 on gtt at 1050 units/hr. CBC stable and no bleeding or infusion issues per RN. Plan for cath on Monday.  Goal of Therapy:  Heparin level 0.3-0.7 units/ml Monitor platelets by anticoagulation protocol: Yes   Plan:  Continue Heparin drip @ 1050 units/hr Daily HL and CBC Monitor for s/sx of bleeding Plan for Cath Monday 6/7  8/7, PharmD PGY1 Ambulatory Care Resident [employer] # [phone]

## 2019-09-26 NOTE — Progress Notes (Signed)
Progress Note  Patient Name: [name]. Date of Encounter: 09/26/2019  [facility] Cardiologist: New  Subjective   No current chest pain  Inpatient Medications    Scheduled Meds: . amLODipine  2.5 mg Oral Daily  . aspirin EC  81 mg Oral Daily  . atorvastatin  80 mg Oral Daily  . buPROPion  300 mg Oral Daily  . carbidopa-levodopa  1 tablet Oral TID  . gabapentin  300 mg Oral Daily  . gabapentin  600 mg Oral QHS  . hydrochlorothiazide  25 mg Oral Daily  . irbesartan  300 mg Oral Daily  . pantoprazole  40 mg Oral BID  . propranolol  20 mg Oral BID   Continuous Infusions: . heparin 1,050 Units/hr (09/26/19 0500)   PRN Meds: acetaminophen, ALPRAZolam, nitroGLYCERIN, ondansetron (ZOFRAN) IV, zolpidem   Vital Signs    Vitals:   09/25/19 2118 09/25/19 2333 09/26/19 0403 09/26/19 0727  BP: (!) 144/97 (!) 141/97 131/87 (!) 145/93  Pulse: (!) 51 (!) 52 (!) 51 (!) 51  Resp: 18  15 18   Temp: 98.1 F (36.7 C)  98.1 F (36.7 C) 97.9 F (36.6 C)  TempSrc: Oral  Oral Oral  SpO2: 98%  96% 96%  Weight:   98.3 kg   Height:        Intake/Output Summary (Last 24 hours) at 09/26/2019 0732 Last data filed at 09/26/2019 0730 Gross per 24 hour  Intake 137.37 ml  Output 1450 ml  Net -1312.63 ml   Last 3 Weights 09/26/2019 09/25/2019 09/25/2019  Weight (lbs) 216 lb 11.2 oz 216 lb 11.2 oz 209 lb  Weight (kg) 98.294 kg 98.294 kg 94.802 kg      Telemetry    SR, sinus brady mid 40s to 50s - Personally Reviewed  ECG    n/a - Personally Reviewed  Physical Exam   GEN: No acute distress.   Neck: No JVD Cardiac: RRR, no murmurs, rubs, or gallops.  Respiratory: Clear to auscultation bilaterally. GI: Soft, nontender, non-distended  MS: No edema; No deformity. Neuro:  Nonfocal  Psych: Normal affect   Labs    High Sensitivity Troponin:   Recent Labs  Lab 09/25/19 1625 09/25/19 1819  TROPONINIHS 83* 103*      Chemistry Recent Labs  Lab 09/25/19 1625 09/26/19 0303   NA 137 137  K 3.9 4.0  CL 106 105  CO2 23 22  GLUCOSE 112* 106*  BUN 21 13  CREATININE 1.00 0.92  CALCIUM 8.5* 8.5*  GFRNONAA >60 >60  GFRAA >60 >60  ANIONGAP 8 10     Hematology Recent Labs  Lab 09/25/19 1625  WBC 6.6  RBC 4.65  HGB 14.9  HCT 43.4  MCV 93.3  MCH 32.0  MCHC 34.3  RDW 13.0  PLT 213    BNPNo results for input(s): BNP, PROBNP in the last 168 hours.   DDimer No results for input(s): DDIMER in the last 168 hours.   Radiology    DG Chest 2 View  Result Date: 09/25/2019 CLINICAL DATA:  Chest pain x2 hours. EXAM: CHEST - 2 VIEW COMPARISON:  January 20, 2017 FINDINGS: Mild atelectasis is seen within the right lung base and mid left lung. There is no evidence of a pleural effusion or pneumothorax. The heart size and mediastinal contours are within normal limits. There is tortuosity of the descending thoracic aorta. Degenerative changes seen throughout the thoracic spine. IMPRESSION: Mild right basilar and mid left lung atelectasis. Electronically Signed  By: Virgina Norfolk M.D.   On: 09/25/2019 17:29    Cardiac Studies     Patient Profile     Christian Scarpelli. is a 74 y.o. male with history of Parkinson's disease, DVT (on pradaxa), stroke, CAD s/p MI w/ PCI in 2002, HTN, DM who presents with chest pain.   Assessment & Plan    1. NSTEMI - history of prior CAD with PCI in 2002 - presents with chest pain, trop up to 103 so far. EKG without acute ischemic changes - echo pending  - medical therapy with ASA 81, atorva 80, hep gtt, ibresartan, propanolol - plan for cath Monday  2. Hx of CVA - on pradaxa at home, on hold in prep for cath  3. Bradycardia -sinus brady on tele mid 71s to 74s. On propanolol for his parkinsons - no significant symptoms, though reports at times unsteady gait, he thinks more related to his parkinsons - lower propanolol to 10mg  bid  4. Unsteady gain - intermittent unsteady gain he reports for some time - after cath would  have PT eval    For questions or updates, please contact [place] Please consult [email or web address] for contact info under        Signed, Carlyle Dolly, MD  09/26/2019, 7:32 AM

## 2019-09-26 NOTE — Progress Notes (Signed)
BP  = 98/71 after NTG 1 sl. C/O Nausea and feeling flush. CP resolved.

## 2019-09-27 ENCOUNTER — Inpatient Hospital Stay (HOSPITAL_COMMUNITY)
Admission: EM | Disposition: A | Discharge status: Home / Self Care | Payer: Self-pay | Source: Home / Self Care | Attending: Cardiology

## 2019-09-27 ENCOUNTER — Inpatient Hospital Stay (HOSPITAL_COMMUNITY): Payer: Medicare Other

## 2019-09-27 DIAGNOSIS — I2510 Atherosclerotic heart disease of native coronary artery without angina pectoris: Secondary | ICD-10-CM

## 2019-09-27 DIAGNOSIS — I509 Heart failure, unspecified: Secondary | ICD-10-CM

## 2019-09-27 DIAGNOSIS — I10 Essential (primary) hypertension: Secondary | ICD-10-CM

## 2019-09-27 DIAGNOSIS — R079 Chest pain, unspecified: Secondary | ICD-10-CM

## 2019-09-27 HISTORY — PX: CORONARY STENT INTERVENTION: CATH118234

## 2019-09-27 HISTORY — PX: INTRAVASCULAR ULTRASOUND/IVUS: CATH118244

## 2019-09-27 HISTORY — PX: LEFT HEART CATH AND CORONARY ANGIOGRAPHY: CATH118249

## 2019-09-27 LAB — BASIC METABOLIC PANEL
Anion gap: 9 (ref 5–15)
BUN: 16 mg/dL (ref 8–23)
CO2: 25 mmol/L (ref 22–32)
Calcium: 8.8 mg/dL — ABNORMAL LOW (ref 8.9–10.3)
Chloride: 104 mmol/L (ref 98–111)
Creatinine, Ser: 1.27 mg/dL — ABNORMAL HIGH (ref 0.61–1.24)
GFR calc Af Amer: >60 mL/min (ref >60)
GFR calc non Af Amer: 55 mL/min — ABNORMAL LOW (ref >60)
Glucose, Bld: 101 mg/dL — ABNORMAL HIGH (ref 70–99)
Potassium: 4.1 mmol/L (ref 3.5–5.1)
Sodium: 138 mmol/L (ref 135–145)

## 2019-09-27 LAB — CBC
HCT: 46.6 % (ref 39.0–52.0)
Hemoglobin: 15.6 g/dL (ref 13.0–17.0)
MCH: 31.5 pg (ref 26.0–34.0)
MCHC: 33.5 g/dL (ref 30.0–36.0)
MCV: 94 fL (ref 80.0–100.0)
Platelets: 195 10*3/uL (ref 150–400)
RBC: 4.96 MIL/uL (ref 4.22–5.81)
RDW: 12.8 % (ref 11.5–15.5)
WBC: 8 10*3/uL (ref 4.0–10.5)
nRBC: 0 % (ref 0.0–0.2)

## 2019-09-27 LAB — TROPONIN I (HIGH SENSITIVITY): Troponin I (High Sensitivity): 316 ng/L (ref <18)

## 2019-09-27 LAB — ECHOCARDIOGRAM LIMITED
Height: 68 in
Weight: 3336 oz

## 2019-09-27 LAB — HEPARIN LEVEL (UNFRACTIONATED): Heparin Unfractionated: 0.43 IU/mL (ref 0.30–0.70)

## 2019-09-27 LAB — POCT ACTIVATED CLOTTING TIME
Activated Clotting Time: 285 seconds
Activated Clotting Time: 334 seconds

## 2019-09-27 SURGERY — LEFT HEART CATH AND CORONARY ANGIOGRAPHY
Anesthesia: LOCAL

## 2019-09-27 MED ORDER — SODIUM CHLORIDE 0.9% FLUSH: 3.0000 mL | Freq: Two times a day (BID) | INTRAVENOUS | Status: DC

## 2019-09-27 MED ORDER — VERAPAMIL HCL 2.5 MG/ML IV SOLN
INTRAVENOUS | Status: AC
  Filled 2019-09-27: qty 2

## 2019-09-27 MED ORDER — MIDAZOLAM HCL 2 MG/2ML IJ SOLN
INTRAMUSCULAR | Status: DC | PRN
  Administered 2019-09-27 (×2): 1 mg via INTRAVENOUS

## 2019-09-27 MED ORDER — ENOXAPARIN SODIUM 40 MG/0.4ML ~~LOC~~ SOLN: 40.0000 mg | SUBCUTANEOUS | Status: DC

## 2019-09-27 MED ORDER — CLOPIDOGREL BISULFATE 300 MG PO TABS
ORAL_TABLET | ORAL | Status: AC
  Filled 2019-09-27: qty 1

## 2019-09-27 MED ORDER — IOHEXOL 350 MG/ML SOLN
INTRAVENOUS | Status: DC | PRN
  Administered 2019-09-27: 130 mL

## 2019-09-27 MED ORDER — PERFLUTREN LIPID MICROSPHERE
1.0000 mL | INTRAVENOUS | Status: DC | PRN
  Administered 2019-09-27: 2 mL via INTRAVENOUS
  Filled 2019-09-27: qty 10

## 2019-09-27 MED ORDER — HEPARIN SODIUM (PORCINE) 1000 UNIT/ML IJ SOLN
INTRAMUSCULAR | Status: AC
  Filled 2019-09-27: qty 1

## 2019-09-27 MED ORDER — LIDOCAINE HCL (PF) 1 % IJ SOLN
INTRAMUSCULAR | Status: AC
  Filled 2019-09-27: qty 30

## 2019-09-27 MED ORDER — CLOPIDOGREL BISULFATE 75 MG PO TABS
75.0000 mg | ORAL_TABLET | Freq: Every day | ORAL | Status: DC
  Administered 2019-09-28: 75 mg via ORAL
  Filled 2019-09-27: qty 1

## 2019-09-27 MED ORDER — FAMOTIDINE IN NACL 20-0.9 MG/50ML-% IV SOLN
INTRAVENOUS | Status: AC | PRN
  Administered 2019-09-27: 20 mg via INTRAVENOUS

## 2019-09-27 MED ORDER — FENTANYL CITRATE (PF) 100 MCG/2ML IJ SOLN
INTRAMUSCULAR | Status: AC
  Filled 2019-09-27: qty 2

## 2019-09-27 MED ORDER — VERAPAMIL HCL 2.5 MG/ML IV SOLN
INTRAVENOUS | Status: DC | PRN
  Administered 2019-09-27: 10 mL via INTRA_ARTERIAL

## 2019-09-27 MED ORDER — FENTANYL CITRATE (PF) 100 MCG/2ML IJ SOLN
INTRAMUSCULAR | Status: DC | PRN
  Administered 2019-09-27 (×2): 25 ug via INTRAVENOUS

## 2019-09-27 MED ORDER — ACETAMINOPHEN 500 MG PO TABS
1000.0000 mg | ORAL_TABLET | Freq: Two times a day (BID) | ORAL | Status: DC
  Administered 2019-09-27 – 2019-09-28 (×3): 1000 mg via ORAL
  Filled 2019-09-27 (×3): qty 2

## 2019-09-27 MED ORDER — CLOPIDOGREL BISULFATE 300 MG PO TABS
ORAL_TABLET | ORAL | Status: DC | PRN
  Administered 2019-09-27: 600 mg via ORAL

## 2019-09-27 MED ORDER — HEPARIN (PORCINE) IN NACL 1000-0.9 UT/500ML-% IV SOLN
INTRAVENOUS | Status: DC | PRN
  Administered 2019-09-27: 500 mL

## 2019-09-27 MED ORDER — HEPARIN (PORCINE) IN NACL 1000-0.9 UT/500ML-% IV SOLN
INTRAVENOUS | Status: AC
  Filled 2019-09-27: qty 1000

## 2019-09-27 MED ORDER — MIDAZOLAM HCL 2 MG/2ML IJ SOLN
INTRAMUSCULAR | Status: AC
  Filled 2019-09-27: qty 2

## 2019-09-27 MED ORDER — ALUM & MAG HYDROXIDE-SIMETH 200-200-20 MG/5ML PO SUSP
30.0000 mL | ORAL | Status: DC | PRN
  Filled 2019-09-27: qty 30

## 2019-09-27 MED ORDER — ONDANSETRON HCL 4 MG/2ML IJ SOLN
INTRAMUSCULAR | Status: AC
  Filled 2019-09-27: qty 2

## 2019-09-27 MED ORDER — NITROGLYCERIN 1 MG/10 ML FOR IR/CATH LAB
INTRA_ARTERIAL | Status: AC
  Filled 2019-09-27: qty 10

## 2019-09-27 MED ORDER — HEPARIN SODIUM (PORCINE) 1000 UNIT/ML IJ SOLN
INTRAMUSCULAR | Status: DC | PRN
  Administered 2019-09-27 (×2): 5000 [IU] via INTRAVENOUS

## 2019-09-27 MED ORDER — LIDOCAINE HCL (PF) 1 % IJ SOLN
INTRAMUSCULAR | Status: DC | PRN
  Administered 2019-09-27: 2 mL

## 2019-09-27 MED ORDER — METOPROLOL TARTRATE 12.5 MG HALF TABLET
12.5000 mg | ORAL_TABLET | Freq: Two times a day (BID) | ORAL | Status: DC
  Administered 2019-09-27 – 2019-09-28 (×3): 12.5 mg via ORAL
  Filled 2019-09-27 (×3): qty 1

## 2019-09-27 MED ORDER — SODIUM CHLORIDE 0.9% FLUSH: 3.0000 mL | INTRAVENOUS | Status: DC | PRN

## 2019-09-27 MED ORDER — SODIUM CHLORIDE 0.9 % IV SOLN: 250.0000 mL | INTRAVENOUS | Status: DC | PRN

## 2019-09-27 MED ORDER — SODIUM CHLORIDE 0.9 % WEIGHT BASED INFUSION
1.0000 mL/kg/h | INTRAVENOUS | Status: AC
  Administered 2019-09-27: 1 mL/kg/h via INTRAVENOUS

## 2019-09-27 MED ORDER — ONDANSETRON HCL 4 MG/2ML IJ SOLN
INTRAMUSCULAR | Status: DC | PRN
  Administered 2019-09-27: 4 mg via INTRAVENOUS

## 2019-09-27 SURGICAL SUPPLY — 18 items
BALLN SAPPHIRE 2.5X12 (BALLOONS) ×2
BALLN SAPPHIRE ~~LOC~~ 3.5X12 (BALLOONS) ×1 IMPLANT
BALLOON SAPPHIRE 2.5X12 (BALLOONS) IMPLANT
CATH 5FR JL3.5 JR4 ANG PIG MP (CATHETERS) ×1 IMPLANT
CATH LAUNCHER 6FR EBU 4 (CATHETERS) ×1 IMPLANT
CATH OPTICROSS HD (CATHETERS) ×1 IMPLANT
DEVICE RAD COMP TR BAND LRG (VASCULAR PRODUCTS) ×1 IMPLANT
GLIDESHEATH SLEND SS 6F .021 (SHEATH) ×1 IMPLANT
GUIDEWIRE INQWIRE 1.5J.035X260 (WIRE) IMPLANT
INQWIRE 1.5J .035X260CM (WIRE) ×2
KIT ENCORE 26 ADVANTAGE (KITS) ×1 IMPLANT
KIT HEART LEFT (KITS) ×2 IMPLANT
PACK CARDIAC CATHETERIZATION (CUSTOM PROCEDURE TRAY) ×2 IMPLANT
SLED PULL BACK IVUS (MISCELLANEOUS) ×1 IMPLANT
STENT RESOLUTE ONYX 3.5X15 (Permanent Stent) ×1 IMPLANT
TRANSDUCER W/STOPCOCK (MISCELLANEOUS) ×2 IMPLANT
TUBING CIL FLEX 10 FLL-RA (TUBING) ×2 IMPLANT
WIRE ASAHI PROWATER 180CM (WIRE) ×1 IMPLANT

## 2019-09-27 NOTE — Progress Notes (Signed)
Echocardiogram 2D Echocardiogram with definity only has been performed.  [name] M 09/27/2019, 12:26 PM

## 2019-09-27 NOTE — H&P (View-Only) (Signed)
Cardiology Progress Note  Patient ID: [name]. MRN: [id number] DOB: 21-Mar-1946 Date of Encounter: 09/27/2019  Primary Cardiologist: No primary care provider on file.  Subjective   Chief Complaint: chest pain   HPI: No CP this AM. Admitted for NSTEMI.   ROS:  All other ROS reviewed and negative. Pertinent positives noted in the HPI.     Inpatient Medications  Scheduled Meds: . amLODipine  2.5 mg Oral Daily  . aspirin EC  81 mg Oral Daily  . atorvastatin  80 mg Oral Daily  . buPROPion  300 mg Oral Daily  . carbidopa-levodopa  1 tablet Oral TID  . gabapentin  300 mg Oral Daily  . gabapentin  600 mg Oral QHS  . hydrochlorothiazide  25 mg Oral Daily  . irbesartan  300 mg Oral Daily  . pantoprazole  40 mg Oral BID  . sodium chloride flush  3 mL Intravenous Q12H   Continuous Infusions: . sodium chloride    . sodium chloride 1 mL/kg/hr (09/27/19 0646)  . heparin 1,050 Units/hr (09/26/19 1524)   PRN Meds: sodium chloride, acetaminophen, ALPRAZolam, loperamide, morphine injection, ondansetron (ZOFRAN) IV, sodium chloride flush, zolpidem   Vital Signs   Vitals:   09/26/19 1300 09/26/19 1328 09/26/19 2137 09/27/19 0549  BP: 115/80 (!) 120/98 (!) 141/82 128/84  Pulse: (!) 53 (!) 50 (!) 50 66  Resp: 18  17 17   Temp: 97.9 F (36.6 C)  98.1 F (36.7 C) 98 F (36.7 C)  TempSrc: Oral  Oral Oral  SpO2: 99% 93% (!) 89% 95%  Weight:    94.6 kg  Height:        Intake/Output Summary (Last 24 hours) at 09/27/2019 0821 Last data filed at 09/26/2019 2205 Gross per 24 hour  Intake 590.99 ml  Output 900 ml  Net -309.01 ml   Last 3 Weights 09/27/2019 09/26/2019 09/25/2019  Weight (lbs) 208 lb 8 oz 216 lb 11.2 oz 216 lb 11.2 oz  Weight (kg) 94.575 kg 98.294 kg 98.294 kg      Telemetry  Overnight telemetry shows NSR 60s, which I personally reviewed.   ECG  The most recent ECG shows SB 51 bpm, 1AVB, septal infarct, which I personally reviewed.   Physical Exam   Vitals:   09/26/19 1300 09/26/19 1328 09/26/19 2137 09/27/19 0549  BP: 115/80 (!) 120/98 (!) 141/82 128/84  Pulse: (!) 53 (!) 50 (!) 50 66  Resp: 18  17 17   Temp: 97.9 F (36.6 C)  98.1 F (36.7 C) 98 F (36.7 C)  TempSrc: Oral  Oral Oral  SpO2: 99% 93% (!) 89% 95%  Weight:    94.6 kg  Height:         Intake/Output Summary (Last 24 hours) at 09/27/2019 0821 Last data filed at 09/26/2019 2205 Gross per 24 hour  Intake 590.99 ml  Output 900 ml  Net -309.01 ml    Last 3 Weights 09/27/2019 09/26/2019 09/25/2019  Weight (lbs) 208 lb 8 oz 216 lb 11.2 oz 216 lb 11.2 oz  Weight (kg) 94.575 kg 98.294 kg 98.294 kg    Body mass index is 31.7 kg/m.  General: Well nourished, well developed, in no acute distress Head: Atraumatic, normal size  Eyes: PEERLA, EOMI  Neck: Supple, no JVD Endocrine: No thryomegaly Cardiac: Normal S1, S2; RRR; no murmurs, rubs, or gallops Lungs: Clear to auscultation bilaterally, no wheezing, rhonchi or rales  Abd: Soft, nontender, no hepatomegaly  Ext: No edema, pulses 2+ Musculoskeletal: No  deformities, BUE and BLE strength normal and equal Skin: Warm and dry, no rashes   Neuro: Alert and oriented to person, place, time, and situation, CNII-XII grossly intact, no focal deficits  Psych: Normal mood and affect   Labs  High Sensitivity Troponin:   Recent Labs  Lab 09/25/19 1625 09/25/19 1819 09/26/19 0944 09/27/19 0405  TROPONINIHS 83* 103* 530* 316*     Cardiac EnzymesNo results for input(s): TROPONINI in the last 168 hours. No results for input(s): TROPIPOC in the last 168 hours.  Chemistry Recent Labs  Lab 09/25/19 1625 09/26/19 0303 09/27/19 0405  NA 137 137 138  K 3.9 4.0 4.1  CL 106 105 104  CO2 23 22 25   GLUCOSE 112* 106* 101*  BUN 21 13 16   CREATININE 1.00 0.92 1.27*  CALCIUM 8.5* 8.5* 8.8*  GFRNONAA >60 >60 55*  GFRAA >60 >60 >60  ANIONGAP 8 10 9     Hematology Recent Labs  Lab 09/25/19 1625 09/26/19 1018 09/27/19 0405  WBC 6.6 5.3 8.0  RBC  4.65 4.49 4.96  HGB 14.9 14.2 15.6  HCT 43.4 42.2 46.6  MCV 93.3 94.0 94.0  MCH 32.0 31.6 31.5  MCHC 34.3 33.6 33.5  RDW 13.0 12.9 12.8  PLT 213 173 195   BNPNo results for input(s): BNP, PROBNP in the last 168 hours.  DDimer No results for input(s): DDIMER in the last 168 hours.   Radiology  DG Chest 2 View  Result Date: 09/25/2019 CLINICAL DATA:  Chest pain x2 hours. EXAM: CHEST - 2 VIEW COMPARISON:  January 20, 2017 FINDINGS: Mild atelectasis is seen within the right lung base and mid left lung. There is no evidence of a pleural effusion or pneumothorax. The heart size and mediastinal contours are within normal limits. There is tortuosity of the descending thoracic aorta. Degenerative changes seen throughout the thoracic spine. IMPRESSION: Mild right basilar and mid left lung atelectasis. Electronically Signed   By: Virgina Norfolk M.D.   On: 09/25/2019 17:29   ECHOCARDIOGRAM COMPLETE  Result Date: 09/26/2019    ECHOCARDIOGRAM REPORT   Patient Name:   [name]. Date of Exam: 09/26/2019 Medical Rec #:  [id number]         Height:       68.0 in Accession #:    [id number]        Weight:       216.7 lb Date of Birth:  1946-02-11          BSA:          2.115 m Patient Age:    74 years          BP:           120/98 mmHg Patient Gender: M                 HR:           50 bpm. Exam Location:  Inpatient Procedure: 2D Echo, Cardiac Doppler and Color Doppler Indications:    Acute MI  History:        Patient has no prior history of Echocardiogram examinations.                 Acute MI, Previous Myocardial Infarction and CAD, Stroke,                 Signs/Symptoms:Chest Pain; Risk Factors:Hypertension, Diabetes                 and Obesity.  Sonographer:    Lavenia Atlas Referring Phys: [id number] ANTHONY PHILIP CARNICELLI IMPRESSIONS  1. The anteroseptal wall and apex are hypokinetic. Limited visualization of LV, difficult to assess LVEF. Recommend limited study with echocontrast to better evaluate  function. . Left ventricular ejection fraction, by estimation, is 40 to 45%. The left ventricle has low normal function. Left ventricular endocardial border not optimally defined to evaluate regional wall motion. There is moderate left ventricular hypertrophy. Left ventricular diastolic parameters are consistent with Grade I diastolic dysfunction (impaired relaxation).  2. Right ventricular systolic function is normal. The right ventricular size is normal.  3. Left atrial size was severely dilated.  4. The mitral valve is normal in structure. Mild mitral valve regurgitation. No evidence of mitral stenosis.  5. The aortic valve is normal in structure. Aortic valve regurgitation is mild.  6. Aortic dilatation noted. There is mild dilatation of the aortic root measuring 40 mm. FINDINGS  Left Ventricle: The anteroseptal wall and apex are hypokinetic. Limited visualization of LV, difficult to assess LVEF. Recommend limited study with echocontrast to better evaluate function. Left ventricular ejection fraction, by estimation, is 40 to 45%. The left ventricle has low normal function. Left ventricular endocardial border not optimally defined to evaluate regional wall motion. The left ventricular internal cavity size was normal in size. There is moderate left ventricular hypertrophy. Left ventricular diastolic parameters are consistent with Grade I diastolic dysfunction (impaired relaxation). Normal left ventricular filling pressure. Right Ventricle: The right ventricular size is normal. No increase in right ventricular wall thickness. Right ventricular systolic function is normal. Left Atrium: Left atrial size was severely dilated. Right Atrium: Right atrial size was not well visualized. Pericardium: There is no evidence of pericardial effusion. Mitral Valve: The mitral valve is normal in structure. There is mild thickening of the mitral valve leaflet(s). There is mild calcification of the mitral valve leaflet(s). Mild  mitral annular calcification. Mild mitral valve regurgitation. No evidence of  mitral valve stenosis. Tricuspid Valve: The tricuspid valve is not well visualized. Tricuspid valve regurgitation is not demonstrated. No evidence of tricuspid stenosis. Aortic Valve: The aortic valve is normal in structure.. There is mild thickening and mild calcification of the aortic valve. Aortic valve regurgitation is mild. Aortic regurgitation PHT measures 715 msec. Mild aortic valve annular calcification. There is  mild thickening of the aortic valve. There is mild calcification of the aortic valve. Aortic valve mean gradient measures 3.6 mmHg. Aortic valve peak gradient measures 6.7 mmHg. Aortic valve area, by VTI measures 4.97 cm. Pulmonic Valve: The pulmonic valve was not well visualized. Pulmonic valve regurgitation is not visualized. No evidence of pulmonic stenosis. Aorta: Aortic dilatation noted. There is mild dilatation of the aortic root measuring 40 mm. Pulmonary Artery: Indeterminant PASP, inadequate TR jet. Venous: The inferior vena cava was not well visualized. IAS/Shunts: The interatrial septum was not well visualized.  LEFT VENTRICLE PLAX 2D LVIDd:         4.93 cm  Diastology LVIDs:         3.81 cm  LV e' lateral:   6.31 cm/s LV PW:         1.43 cm  LV E/e' lateral: 10.0 LV IVS:        1.35 cm  LV e' medial:    4.24 cm/s LVOT diam:     2.40 cm  LV E/e' medial:  15.0 LV SV:         138 LV SV Index:   65 LVOT Area:  4.52 cm  RIGHT VENTRICLE RV Basal diam:  3.59 cm RV S prime:     13.00 cm/s TAPSE (M-mode): 3.1 cm LEFT ATRIUM             Index       RIGHT ATRIUM           Index LA diam:        3.90 cm 1.84 cm/m  RA Area:     22.60 cm LA Vol (A2C):   66.5 ml 31.45 ml/m RA Volume:   62.40 ml  29.51 ml/m LA Vol (A4C):   88.6 ml 41.90 ml/m LA Biplane Vol: 79.5 ml 37.60 ml/m  AORTIC VALVE AV Area (Vmax):    4.70 cm AV Area (Vmean):   3.74 cm AV Area (VTI):     4.97 cm AV Vmax:           129.10 cm/s AV Vmean:           89.100 cm/s AV VTI:            0.277 m AV Peak Grad:      6.7 mmHg AV Mean Grad:      3.6 mmHg LVOT Vmax:         134.00 cm/s LVOT Vmean:        73.600 cm/s LVOT VTI:          0.305 m LVOT/AV VTI ratio: 1.10 AI PHT:            715 msec  AORTA Ao Root diam: 4.00 cm MITRAL VALVE MV Area (PHT): 4.80 cm    SHUNTS MV Decel Time: 158 msec    Systemic VTI:  0.30 m MV E velocity: 63.40 cm/s  Systemic Diam: 2.40 cm MV A velocity: 75.80 cm/s MV E/A ratio:  0.84 Dina Rich MD Electronically signed by Dina Rich MD Signature Date/Time: 09/26/2019/3:45:05 PM    Final     Cardiac Studies  TTE 09/26/2019 1. The anteroseptal wall and apex are hypokinetic. Limited visualization  of LV, difficult to assess LVEF. Recommend limited study with echocontrast  to better evaluate function. . Left ventricular ejection fraction, by  estimation, is 40 to 45%. The left  ventricle has low normal function. Left ventricular endocardial border not  optimally defined to evaluate regional wall motion. There is moderate left  ventricular hypertrophy. Left ventricular diastolic parameters are  consistent with Grade I diastolic  dysfunction (impaired relaxation).  2. Right ventricular systolic function is normal. The right ventricular  size is normal.  3. Left atrial size was severely dilated.  4. The mitral valve is normal in structure. Mild mitral valve  regurgitation. No evidence of mitral stenosis.  5. The aortic valve is normal in structure. Aortic valve regurgitation is  mild.  6. Aortic dilatation noted. 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   Patient Profile  [name]. is a 74 y.o. male with Parkinson's, CAD s/p PCI in 2002, HTN, DM, DVT who was admitted 09/25/2019 with NSTEMI.   Assessment & Plan   1. NSTEMI: CP, elevated trop, echo with WMA. ACS. Continue ASA/heparin drip. NPO for LHC today. Continue high intensity statin. Will add metoprolol tartrate 12.5 mg BID to  see if he tolerates. Echo with EF 40-45% and anterior MI. On ARB.  2. HFrEF: Subclinical LV dysfunction. On ARB. Will consider aldactone. Metoprolol tartrate 12.5 mg BID and switch to succinate if tolerates.  3. HTN: Irbesartan, HCTZ, metoprolol, norvasc.  4. DM: A1c 6.0.  5. DVT: Restart pradaxa after cath.  FEN -pre cath IVF -code: full -dvt: heparin -diet: NPO  For questions or updates, please contact [facility] Please consult [email or web address] for contact info under   Time Spent with Patient: I have spent a total of 25 minutes with patient reviewing hospital notes, telemetry, EKGs, labs and examining the patient as well as establishing an assessment and plan that was discussed with the patient.  > 50% of time was spent in direct patient care.    Signed, Chualar T. O'Neal, MD [place]  [facility]  09/27/2019 8:21 AM   

## 2019-09-27 NOTE — Progress Notes (Signed)
Cardiology Progress Note  Patient ID: [name]. MRN: [id number] DOB: 21-Mar-1946 Date of Encounter: 09/27/2019  Primary Cardiologist: No primary care provider on file.  Subjective   Chief Complaint: chest pain   HPI: No CP this AM. Admitted for NSTEMI.   ROS:  All other ROS reviewed and negative. Pertinent positives noted in the HPI.     Inpatient Medications  Scheduled Meds: . amLODipine  2.5 mg Oral Daily  . aspirin EC  81 mg Oral Daily  . atorvastatin  80 mg Oral Daily  . buPROPion  300 mg Oral Daily  . carbidopa-levodopa  1 tablet Oral TID  . gabapentin  300 mg Oral Daily  . gabapentin  600 mg Oral QHS  . hydrochlorothiazide  25 mg Oral Daily  . irbesartan  300 mg Oral Daily  . pantoprazole  40 mg Oral BID  . sodium chloride flush  3 mL Intravenous Q12H   Continuous Infusions: . sodium chloride    . sodium chloride 1 mL/kg/hr (09/27/19 0646)  . heparin 1,050 Units/hr (09/26/19 1524)   PRN Meds: sodium chloride, acetaminophen, ALPRAZolam, loperamide, morphine injection, ondansetron (ZOFRAN) IV, sodium chloride flush, zolpidem   Vital Signs   Vitals:   09/26/19 1300 09/26/19 1328 09/26/19 2137 09/27/19 0549  BP: 115/80 (!) 120/98 (!) 141/82 128/84  Pulse: (!) 53 (!) 50 (!) 50 66  Resp: 18  17 17   Temp: 97.9 F (36.6 C)  98.1 F (36.7 C) 98 F (36.7 C)  TempSrc: Oral  Oral Oral  SpO2: 99% 93% (!) 89% 95%  Weight:    94.6 kg  Height:        Intake/Output Summary (Last 24 hours) at 09/27/2019 0821 Last data filed at 09/26/2019 2205 Gross per 24 hour  Intake 590.99 ml  Output 900 ml  Net -309.01 ml   Last 3 Weights 09/27/2019 09/26/2019 09/25/2019  Weight (lbs) 208 lb 8 oz 216 lb 11.2 oz 216 lb 11.2 oz  Weight (kg) 94.575 kg 98.294 kg 98.294 kg      Telemetry  Overnight telemetry shows NSR 60s, which I personally reviewed.   ECG  The most recent ECG shows SB 51 bpm, 1AVB, septal infarct, which I personally reviewed.   Physical Exam   Vitals:   09/26/19 1300 09/26/19 1328 09/26/19 2137 09/27/19 0549  BP: 115/80 (!) 120/98 (!) 141/82 128/84  Pulse: (!) 53 (!) 50 (!) 50 66  Resp: 18  17 17   Temp: 97.9 F (36.6 C)  98.1 F (36.7 C) 98 F (36.7 C)  TempSrc: Oral  Oral Oral  SpO2: 99% 93% (!) 89% 95%  Weight:    94.6 kg  Height:         Intake/Output Summary (Last 24 hours) at 09/27/2019 0821 Last data filed at 09/26/2019 2205 Gross per 24 hour  Intake 590.99 ml  Output 900 ml  Net -309.01 ml    Last 3 Weights 09/27/2019 09/26/2019 09/25/2019  Weight (lbs) 208 lb 8 oz 216 lb 11.2 oz 216 lb 11.2 oz  Weight (kg) 94.575 kg 98.294 kg 98.294 kg    Body mass index is 31.7 kg/m.  General: Well nourished, well developed, in no acute distress Head: Atraumatic, normal size  Eyes: PEERLA, EOMI  Neck: Supple, no JVD Endocrine: No thryomegaly Cardiac: Normal S1, S2; RRR; no murmurs, rubs, or gallops Lungs: Clear to auscultation bilaterally, no wheezing, rhonchi or rales  Abd: Soft, nontender, no hepatomegaly  Ext: No edema, pulses 2+ Musculoskeletal: No  deformities, BUE and BLE strength normal and equal Skin: Warm and dry, no rashes   Neuro: Alert and oriented to person, place, time, and situation, CNII-XII grossly intact, no focal deficits  Psych: Normal mood and affect   Labs  High Sensitivity Troponin:   Recent Labs  Lab 09/25/19 1625 09/25/19 1819 09/26/19 0944 09/27/19 0405  TROPONINIHS 83* 103* 530* 316*     Cardiac EnzymesNo results for input(s): TROPONINI in the last 168 hours. No results for input(s): TROPIPOC in the last 168 hours.  Chemistry Recent Labs  Lab 09/25/19 1625 09/26/19 0303 09/27/19 0405  NA 137 137 138  K 3.9 4.0 4.1  CL 106 105 104  CO2 23 22 25   GLUCOSE 112* 106* 101*  BUN 21 13 16   CREATININE 1.00 0.92 1.27*  CALCIUM 8.5* 8.5* 8.8*  GFRNONAA >60 >60 55*  GFRAA >60 >60 >60  ANIONGAP 8 10 9     Hematology Recent Labs  Lab 09/25/19 1625 09/26/19 1018 09/27/19 0405  WBC 6.6 5.3 8.0  RBC  4.65 4.49 4.96  HGB 14.9 14.2 15.6  HCT 43.4 42.2 46.6  MCV 93.3 94.0 94.0  MCH 32.0 31.6 31.5  MCHC 34.3 33.6 33.5  RDW 13.0 12.9 12.8  PLT 213 173 195   BNPNo results for input(s): BNP, PROBNP in the last 168 hours.  DDimer No results for input(s): DDIMER in the last 168 hours.   Radiology  DG Chest 2 View  Result Date: 09/25/2019 CLINICAL DATA:  Chest pain x2 hours. EXAM: CHEST - 2 VIEW COMPARISON:  January 20, 2017 FINDINGS: Mild atelectasis is seen within the right lung base and mid left lung. There is no evidence of a pleural effusion or pneumothorax. The heart size and mediastinal contours are within normal limits. There is tortuosity of the descending thoracic aorta. Degenerative changes seen throughout the thoracic spine. IMPRESSION: Mild right basilar and mid left lung atelectasis. Electronically Signed   By: Virgina Norfolk M.D.   On: 09/25/2019 17:29   ECHOCARDIOGRAM COMPLETE  Result Date: 09/26/2019    ECHOCARDIOGRAM REPORT   Patient Name:   [name]. Date of Exam: 09/26/2019 Medical Rec #:  [id number]         Height:       68.0 in Accession #:    [id number]        Weight:       216.7 lb Date of Birth:  1946-02-11          BSA:          2.115 m Patient Age:    74 years          BP:           120/98 mmHg Patient Gender: M                 HR:           50 bpm. Exam Location:  Inpatient Procedure: 2D Echo, Cardiac Doppler and Color Doppler Indications:    Acute MI  History:        Patient has no prior history of Echocardiogram examinations.                 Acute MI, Previous Myocardial Infarction and CAD, Stroke,                 Signs/Symptoms:Chest Pain; Risk Factors:Hypertension, Diabetes                 and Obesity.  Sonographer:    Lavenia Atlas Referring Phys: [id number] ANTHONY PHILIP CARNICELLI IMPRESSIONS  1. The anteroseptal wall and apex are hypokinetic. Limited visualization of LV, difficult to assess LVEF. Recommend limited study with echocontrast to better evaluate  function. . Left ventricular ejection fraction, by estimation, is 40 to 45%. The left ventricle has low normal function. Left ventricular endocardial border not optimally defined to evaluate regional wall motion. There is moderate left ventricular hypertrophy. Left ventricular diastolic parameters are consistent with Grade I diastolic dysfunction (impaired relaxation).  2. Right ventricular systolic function is normal. The right ventricular size is normal.  3. Left atrial size was severely dilated.  4. The mitral valve is normal in structure. Mild mitral valve regurgitation. No evidence of mitral stenosis.  5. The aortic valve is normal in structure. Aortic valve regurgitation is mild.  6. Aortic dilatation noted. There is mild dilatation of the aortic root measuring 40 mm. FINDINGS  Left Ventricle: The anteroseptal wall and apex are hypokinetic. Limited visualization of LV, difficult to assess LVEF. Recommend limited study with echocontrast to better evaluate function. Left ventricular ejection fraction, by estimation, is 40 to 45%. The left ventricle has low normal function. Left ventricular endocardial border not optimally defined to evaluate regional wall motion. The left ventricular internal cavity size was normal in size. There is moderate left ventricular hypertrophy. Left ventricular diastolic parameters are consistent with Grade I diastolic dysfunction (impaired relaxation). Normal left ventricular filling pressure. Right Ventricle: The right ventricular size is normal. No increase in right ventricular wall thickness. Right ventricular systolic function is normal. Left Atrium: Left atrial size was severely dilated. Right Atrium: Right atrial size was not well visualized. Pericardium: There is no evidence of pericardial effusion. Mitral Valve: The mitral valve is normal in structure. There is mild thickening of the mitral valve leaflet(s). There is mild calcification of the mitral valve leaflet(s). Mild  mitral annular calcification. Mild mitral valve regurgitation. No evidence of  mitral valve stenosis. Tricuspid Valve: The tricuspid valve is not well visualized. Tricuspid valve regurgitation is not demonstrated. No evidence of tricuspid stenosis. Aortic Valve: The aortic valve is normal in structure.. There is mild thickening and mild calcification of the aortic valve. Aortic valve regurgitation is mild. Aortic regurgitation PHT measures 715 msec. Mild aortic valve annular calcification. There is  mild thickening of the aortic valve. There is mild calcification of the aortic valve. Aortic valve mean gradient measures 3.6 mmHg. Aortic valve peak gradient measures 6.7 mmHg. Aortic valve area, by VTI measures 4.97 cm. Pulmonic Valve: The pulmonic valve was not well visualized. Pulmonic valve regurgitation is not visualized. No evidence of pulmonic stenosis. Aorta: Aortic dilatation noted. There is mild dilatation of the aortic root measuring 40 mm. Pulmonary Artery: Indeterminant PASP, inadequate TR jet. Venous: The inferior vena cava was not well visualized. IAS/Shunts: The interatrial septum was not well visualized.  LEFT VENTRICLE PLAX 2D LVIDd:         4.93 cm  Diastology LVIDs:         3.81 cm  LV e' lateral:   6.31 cm/s LV PW:         1.43 cm  LV E/e' lateral: 10.0 LV IVS:        1.35 cm  LV e' medial:    4.24 cm/s LVOT diam:     2.40 cm  LV E/e' medial:  15.0 LV SV:         138 LV SV Index:   65 LVOT Area:  4.52 cm  RIGHT VENTRICLE RV Basal diam:  3.59 cm RV S prime:     13.00 cm/s TAPSE (M-mode): 3.1 cm LEFT ATRIUM             Index       RIGHT ATRIUM           Index LA diam:        3.90 cm 1.84 cm/m  RA Area:     22.60 cm LA Vol (A2C):   66.5 ml 31.45 ml/m RA Volume:   62.40 ml  29.51 ml/m LA Vol (A4C):   88.6 ml 41.90 ml/m LA Biplane Vol: 79.5 ml 37.60 ml/m  AORTIC VALVE AV Area (Vmax):    4.70 cm AV Area (Vmean):   3.74 cm AV Area (VTI):     4.97 cm AV Vmax:           129.10 cm/s AV Vmean:           89.100 cm/s AV VTI:            0.277 m AV Peak Grad:      6.7 mmHg AV Mean Grad:      3.6 mmHg LVOT Vmax:         134.00 cm/s LVOT Vmean:        73.600 cm/s LVOT VTI:          0.305 m LVOT/AV VTI ratio: 1.10 AI PHT:            715 msec  AORTA Ao Root diam: 4.00 cm MITRAL VALVE MV Area (PHT): 4.80 cm    SHUNTS MV Decel Time: 158 msec    Systemic VTI:  0.30 m MV E velocity: 63.40 cm/s  Systemic Diam: 2.40 cm MV A velocity: 75.80 cm/s MV E/A ratio:  0.84 Dina Rich MD Electronically signed by Dina Rich MD Signature Date/Time: 09/26/2019/3:45:05 PM    Final     Cardiac Studies  TTE 09/26/2019 1. The anteroseptal wall and apex are hypokinetic. Limited visualization  of LV, difficult to assess LVEF. Recommend limited study with echocontrast  to better evaluate function. . Left ventricular ejection fraction, by  estimation, is 40 to 45%. The left  ventricle has low normal function. Left ventricular endocardial border not  optimally defined to evaluate regional wall motion. There is moderate left  ventricular hypertrophy. Left ventricular diastolic parameters are  consistent with Grade I diastolic  dysfunction (impaired relaxation).  2. Right ventricular systolic function is normal. The right ventricular  size is normal.  3. Left atrial size was severely dilated.  4. The mitral valve is normal in structure. Mild mitral valve  regurgitation. No evidence of mitral stenosis.  5. The aortic valve is normal in structure. Aortic valve regurgitation is  mild.  6. Aortic dilatation noted. 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   Patient Profile  [name]. is a 74 y.o. male with Parkinson's, CAD s/p PCI in 2002, HTN, DM, DVT who was admitted 09/25/2019 with NSTEMI.   Assessment & Plan   1. NSTEMI: CP, elevated trop, echo with WMA. ACS. Continue ASA/heparin drip. NPO for LHC today. Continue high intensity statin. Will add metoprolol tartrate 12.5 mg BID to  see if he tolerates. Echo with EF 40-45% and anterior MI. On ARB.  2. HFrEF: Subclinical LV dysfunction. On ARB. Will consider aldactone. Metoprolol tartrate 12.5 mg BID and switch to succinate if tolerates.  3. HTN: Irbesartan, HCTZ, metoprolol, norvasc.  4. DM: A1c 6.0.  5. DVT: Restart pradaxa after cath.  FEN -pre cath IVF -code: full -dvt: heparin -diet: NPO  For questions or updates, please contact [facility] Please consult [email or web address] for contact info under   Time Spent with Patient: I have spent a total of 25 minutes with patient reviewing hospital notes, telemetry, EKGs, labs and examining the patient as well as establishing an assessment and plan that was discussed with the patient.  > 50% of time was spent in direct patient care.    Signed, Lenna Gilford. Flora Lipps, MD [place]  [facility]  09/27/2019 8:21 AM

## 2019-09-27 NOTE — Progress Notes (Signed)
Patient reported having loose stools, given imodium. Notified Dr. Scharlene Gloss and PA. No orders given. Dr. Scharlene Gloss stated that patient did not have any risk factors for c-diff and we would not need to send a sample.

## 2019-09-27 NOTE — Progress Notes (Signed)
ANTICOAGULATION CONSULT NOTE  Pharmacy Consult for Heparin Indication: chest pain/ACS  Allergies  Allergen Reactions   Lisinopril Cough   Nitroglycerin Nausea Only    Drop in blood pressure with vagally mediated nausea,diaphoresis with single dose of SL NG.     Patient Measurements: Height: 5\' 8"  (172.7 cm) Weight: 94.6 kg (208 lb 8 oz) IBW/kg (Calculated) : 68.4 Heparin Dosing Weight: 88.3 kg  Vital Signs: Temp: 98 F (36.7 C) (06/07 0549) Temp Source: Oral (06/07 0549) BP: 128/84 (06/07 0549) Pulse Rate: 66 (06/07 0549)  Labs: Recent Labs    09/25/19 1625 09/25/19 1625 09/25/19 1819 09/26/19 0303 09/26/19 0944 09/26/19 1018 09/27/19 0405  HGB 14.9   < >  --   --   --  14.2 15.6  HCT 43.4  --   --   --   --  42.2 46.6  PLT 213  --   --   --   --  173 195  APTT  --   --   --   --   --  76*  --   HEPARINUNFRC  --   --   --  0.40  --  0.34 0.43  CREATININE 1.00  --   --  0.92  --   --  1.27*  TROPONINIHS 83*   < > 103*  --  530*  --  316*   < > = values in this interval not displayed.    Estimated Creatinine Clearance: 56.9 mL/min (A) (by C-G formula based on SCr of 1.27 mg/dL (H)).   Assessment: Patient is a 29 yom that presented to the ED with CP and SOB. PMH significant for DVT (on pradaxa - last dose 6/5 AM), stroke, CAD s/p MI w/ PCI in 2002, HTN, DM, and Parkinson's disease. The patient was found to have an elevated trop and pharmacy has been asked to dose heparin at this time.  Heparin level therapeutic this morning, CBC stable.  Goal of Therapy:  Heparin level 0.3-0.7 units/ml Monitor platelets by anticoagulation protocol: Yes   Plan:  -Continue heparin 1050 units/h -Daily heparin level and CBC   2003, PharmD, BCPS Clinical Pharmacist [phone] Please check AMION for all [facility] numbers 09/27/2019

## 2019-09-27 NOTE — Interval H&P Note (Signed)
History and Physical Interval Note:  09/27/2019 1:08 PM  [name].  has presented today for surgery, with the diagnosis of NSTEMI.  The various methods of treatment have been discussed with the patient and family. After consideration of risks, benefits and other options for treatment, the patient has consented to  Procedure(s): LEFT HEART CATH AND CORONARY ANGIOGRAPHY (N/A) as a surgical intervention.  The patient's history has been reviewed, patient examined, no change in status, stable for surgery.  I have reviewed the patient's chart and labs.  Questions were answered to the patient's satisfaction.   Cath Lab Visit (complete for each Cath Lab visit)  Clinical Evaluation Leading to the Procedure:   ACS: Yes.    Non-ACS:    Anginal Classification: CCS IV  Anti-ischemic medical therapy: Minimal Therapy (1 class of medications)  Non-Invasive Test Results: No non-invasive testing performed  Prior CABG: No previous CABG        [name] [facility] 09/27/2019 1:08 PM

## 2019-09-28 LAB — BASIC METABOLIC PANEL
Anion gap: 9 (ref 5–15)
BUN: 11 mg/dL (ref 8–23)
CO2: 26 mmol/L (ref 22–32)
Calcium: 8.4 mg/dL — ABNORMAL LOW (ref 8.9–10.3)
Chloride: 104 mmol/L (ref 98–111)
Creatinine, Ser: 1.23 mg/dL (ref 0.61–1.24)
GFR calc Af Amer: >60 mL/min (ref >60)
GFR calc non Af Amer: 57 mL/min — ABNORMAL LOW (ref >60)
Glucose, Bld: 101 mg/dL — ABNORMAL HIGH (ref 70–99)
Potassium: 3.9 mmol/L (ref 3.5–5.1)
Sodium: 139 mmol/L (ref 135–145)

## 2019-09-28 LAB — CBC
HCT: 43.5 % (ref 39.0–52.0)
Hemoglobin: 14.4 g/dL (ref 13.0–17.0)
MCH: 31.9 pg (ref 26.0–34.0)
MCHC: 33.1 g/dL (ref 30.0–36.0)
MCV: 96.5 fL (ref 80.0–100.0)
Platelets: 184 10*3/uL (ref 150–400)
RBC: 4.51 MIL/uL (ref 4.22–5.81)
RDW: 12.9 % (ref 11.5–15.5)
WBC: 7.2 10*3/uL (ref 4.0–10.5)
nRBC: 0 % (ref 0.0–0.2)

## 2019-09-28 LAB — GLUCOSE, CAPILLARY: Glucose-Capillary: 88 mg/dL (ref 70–99)

## 2019-09-28 MED ORDER — CLOPIDOGREL BISULFATE 75 MG PO TABS: 75.0000 mg | ORAL_TABLET | Freq: Every day | ORAL | 3 refills | Status: DC

## 2019-09-28 MED ORDER — ATORVASTATIN CALCIUM 80 MG PO TABS: 80.0000 mg | ORAL_TABLET | Freq: Every day | ORAL | 4 refills | Status: AC

## 2019-09-28 MED ORDER — HYDROCHLOROTHIAZIDE 25 MG PO TABS: 25.0000 mg | ORAL_TABLET | Freq: Every day | ORAL | 6 refills | Status: DC

## 2019-09-28 MED ORDER — DABIGATRAN ETEXILATE MESYLATE 150 MG PO CAPS
150.0000 mg | ORAL_CAPSULE | Freq: Two times a day (BID) | ORAL | Status: DC
  Administered 2019-09-28: 150 mg via ORAL
  Filled 2019-09-28: qty 1

## 2019-09-28 MED ORDER — METOPROLOL TARTRATE 25 MG PO TABS: 12.5000 mg | ORAL_TABLET | Freq: Two times a day (BID) | ORAL | 6 refills | Status: DC

## 2019-09-28 MED ORDER — IRBESARTAN 300 MG PO TABS: 300.0000 mg | ORAL_TABLET | Freq: Every day | ORAL | 6 refills | Status: AC

## 2019-09-28 MED FILL — Nitroglycerin IV Soln 100 MCG/ML in D5W: INTRA_ARTERIAL | Qty: 10 | Status: AC

## 2019-09-28 NOTE — Progress Notes (Signed)
CARDIAC REHAB PHASE I   PRE:  Rate/Rhythm: 58 SB    BP: sitting 114/64    SaO2:   MODE:  Ambulation: 470 ft   POST:  Rate/Rhythm: 67 SR    BP: sitting 125/56     SaO2:   Pt slightly weak and unsteady but no assist needed. He admits that his Parkinsons and back pain limit his mobility. He also has claudication sx in calves with distance. Denied CP or SOB. Discussed MI, stent, restrictions, Plavix, increasing mobility/exercise, NTG, diet and CRPII. Pt receptive. Will refer to [facility].  [id number]   Harriet Masson CES, ACSM 09/28/2019 10:01 AM

## 2019-09-28 NOTE — Discharge Summary (Addendum)
Discharge Summary    Patient ID: [name]. MRN: [id number]; DOB: 06-Feb-1946  Admit date: 09/25/2019 Discharge date: 09/28/2019  Primary Care Provider: Loyal Jacobson, MD  Primary Cardiologist: New, follow up in [facility] Primary Electrophysiologist:  None   Discharge Diagnoses    Principal Problem:   NSTEMI (non-ST elevated myocardial infarction) [facility]) Active Problems:   ACS (acute coronary syndrome) (HCC)   HTN (hypertension)   Parkinson's disease (HCC)   HLD (hyperlipidemia)   Type 2 diabetes mellitus without complication, without long-term current use of insulin (HCC)   CAD (coronary artery disease) s/p PCI in 2002,  DES to pLAD 09/2019   Bradycardia   DVT (deep venous thrombosis) (HCC)   Diagnostic Studies/Procedures    Cardiac catheterization 09/27/2019  Mid Cx lesion is 60% stenosed.  Prox RCA lesion is 50% stenosed.  LV end diastolic pressure is mildly elevated.  Prox LAD to Mid LAD lesion is 20% stenosed.  Prox LAD lesion is 95% stenosed.  Post intervention, there is a 0% residual stenosis.  A drug-eluting stent was successfully placed using a STENT RESOLUTE ONYX 3.5X15.   1. Single vessel obstructive CAD with 95% stenosis in the proximal LAD. This appears to be proximal to old stent. 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  2. Mildly elevated LVEDP 19 mm Hg 3. Successful PCI of the proximal LAD with DES x 1 using IVUS guidance.  Plan: DAPT with ASA for one month and Plavix for 12 months. Can resume oral anticoagulation tomorrow if no bleeding. Anticipate DC tomorrow.   Coronary Diagrams  Diagnostic Dominance: Right  Intervention       Echo 09/26/19 1. The anteroseptal wall and apex are hypokinetic. Limited visualization of LV, difficult to assess LVEF. Recommend limited study with echocontrast to better evaluate function. . Left ventricular ejection fraction, by estimation, is 40 to 45%. The left  ventricle  has low normal function. Left ventricular endocardial border not optimally defined to evaluate regional wall motion. There is moderate left ventricular hypertrophy. Left ventricular diastolic parameters are consistent with Grade I diastolic  dysfunction (impaired relaxation).  2. Right ventricular systolic function is normal. The right ventricular size is normal.  3. Left atrial size was severely dilated.  4. The mitral valve is normal in structure. Mild mitral valve regurgitation. No evidence of mitral stenosis.  5. The aortic valve is normal in structure. Aortic valve regurgitation is mild.  6. Aortic dilatation noted. 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   Echo 09/27/19 1. Left ventricular ejection fraction, by estimation, is 45 to 50%. The left ventricle has mildly decreased function. The left ventricle  demonstrates regional wall motion abnormalities (see scoring  diagram/findings for description). There is moderate  hypokinesis of the left ventricular, mid-apical anterior wall,  anteroseptal wall and apical segment.   FINDINGS  Left Ventricle: Left ventricular ejection fraction, by estimation, is 45 to 50%. The left ventricle has mildly decreased function. The left ventricle demonstrates regional wall motion abnormalities. Moderate hypokinesis of the left ventricular, mid-apical anterior wall, anteroseptal wall and apical segment. Definity contrast agent was given IV to delineate the left ventricular endocardial borders.  _____________   History of Present Illness     [name]. is a 74 y.o. male with history of Parkinson's disease, DVT (pradaxa), stroke, CAD s/p MI w/ PCI in 2002, HTN, and DM who presents for chest pain. The chest pain began spontaneously earlier  that day. He had intermittent chest pains prior to that, but this one was more severe. It began at rest and in the central chest with radiation to the neck and jaw. He took 1 dose of SLN which  did minimally improve his symptoms. When the pain persisted he went to [facility] ED for work-up.   Hospital Course     Consultants: None   In the ED he was given IV morphine and the pain resolved. He was hypertensive and braydcardic on arrival. EKG showed no acute ischemic changes. Initial troponin was found to be elevated at 83. He was given full dose aspirin and started on heparin drip. He was transferred to [facility] for further work-up. On arrival to [facility] the patient was chest pain free. He was started on ASA 81, atorvastatin 80, propanolol and patient was scheduled for cath. Propanolol was decreased to  BID for bradycardia. Troponin uptrended to 500. Propanolol was later discontinued to do persistent bradycardia. Echo showed EF 40-45%, G1DD, normal RV function, severely dilated LA, mild MR, mild AI, aortic dilation to 40mm. Recommended limited study with echocontrast to better evaluate function. With contrast echo showed EF was 45-50%, regional wall motion abnormalities. Lipid profile showed HDL 62, LDL 68, TG 63. Hgb A1C came back to 6.0. The patient was taken for cath which showed single vessel obstruction CAD with 95% stenosis in the proximal LAD which appeared to be proximal to old stent. 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 He was treated with successful PCI to proximal LAD with DES x 1 using IVUS guidance. Patient tolerated procedure well. Will plan for one month of aspirin, plavix and pradaxa and then plavix/pradaxa for 1 year. Cath site right radial remained stable with no signs of bleeding, bruising, or tenderness. Patient did not have recurrent chest pain. Patient was stable while working with cardiac rehab. New prescriptions were sent in for plavix 75 mg daily, HCTZ  daily, Irbesartan 300 mg daily, and Lopressor 12.5mg  BID. Hospital follow-up was arranged with the plan to later follow-up with cardiology in [facility].   The patient was evaluated by Dr. Flora Lipps on 09/28/19 and felt to be stable for discharge.   Did the patient have an acute coronary syndrome (MI, NSTEMI, STEMI, etc) this admission?:  Yes                               AHA/ACC Clinical Performance & Quality Measures: 1. Aspirin prescribed? - Yes 2. ADP Receptor Inhibitor (Plavix/Clopidogrel, Brilinta/Ticagrelor or Effient/Prasugrel) prescribed (includes medically managed patients)? - Yes 3. Beta Blocker prescribed? - Yes 4. High Intensity Statin (Lipitor 40-80mg  or Crestor 20-40mg ) prescribed? - Yes 5. EF assessed during THIS hospitalization? - Yes 6. For EF <40%, was ACEI/ARB prescribed? - Not Applicable (EF >/= 40%) 7. For EF <40%, Aldosterone Antagonist (Spironolactone or Eplerenone) prescribed? - Not Applicable (EF >/= 40%) 8. Cardiac Rehab Phase II ordered (including medically managed patients)? - Yes   _____________  Discharge Vitals Blood pressure 131/74, pulse 60, temperature 98.7 F (37.1 C), temperature source Oral, resp. rate 18, height  (1.727 m), weight 95.4 kg, SpO2 98 %.  Filed Weights   09/26/19 0403 09/27/19 0549 09/28/19 0421  Weight: 98.3 kg 94.6 kg 95.4 kg    Labs & Radiologic Studies    CBC Recent Labs    09/27/19 0405 09/28/19 0353  WBC 8.0 7.2  HGB 15.6 14.4  HCT 46.6 43.5  MCV 94.0 96.5  PLT 195 671   Basic Metabolic Panel Recent Labs    09/27/19 0405 09/28/19 0353  NA 138 139  K 4.1 3.9  CL 104 104  CO2 25 26  GLUCOSE 101* 101*  BUN 16 11  CREATININE 1.27* 1.23  CALCIUM 8.8* 8.4*   Liver Function Tests No results for input(s): AST, ALT, ALKPHOS, BILITOT, PROT, ALBUMIN in the last 72 hours. No results for input(s): LIPASE, AMYLASE in the last 72 hours. High Sensitivity Troponin:   Recent Labs  Lab 09/25/19 1625 09/25/19 1819 09/26/19 0944 09/27/19 0405  TROPONINIHS 83* 103* 530* 316*    BNP Invalid input(s): POCBNP D-Dimer No results for input(s): DDIMER in the last  72 hours. Hemoglobin A1C Recent Labs    09/26/19 0303  HGBA1C 6.0*   Fasting Lipid Panel Recent Labs    09/26/19 0303  CHOL 143  HDL 62  LDLCALC 68  TRIG 63  CHOLHDL 2.3   Thyroid Function Tests No results for input(s): TSH, T4TOTAL, T3FREE, THYROIDAB in the last 72 hours.  Invalid input(s): FREET3 _____________  DG Chest 2 View  Result Date: 09/25/2019 CLINICAL DATA:  Chest pain x2 hours. EXAM: CHEST - 2 VIEW COMPARISON:  January 20, 2017 FINDINGS: Mild atelectasis is seen within the right lung base and mid left lung. There is no evidence of a pleural effusion or pneumothorax. The heart size and mediastinal contours are within normal limits. There is tortuosity of the descending thoracic aorta. Degenerative changes seen throughout the thoracic spine. IMPRESSION: Mild right basilar and mid left lung atelectasis. Electronically Signed   By: Virgina Norfolk M.D.   On: 09/25/2019 17:29   CARDIAC CATHETERIZATION  Result Date: 09/27/2019  Mid Cx lesion is 60% stenosed.  Prox RCA lesion is 50% stenosed.  LV end diastolic pressure is mildly elevated.  Prox LAD to Mid LAD lesion is 20% stenosed.  Prox LAD lesion is 95% stenosed.  Post intervention, there is a 0% residual stenosis.  A drug-eluting stent was successfully placed using a STENT RESOLUTE ONYX 3.5X15.  1. Single vessel obstructive CAD with 95% stenosis in the proximal LAD. This appears to be proximal to old stent. 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 2. Mildly elevated LVEDP 19 mm Hg 3. Successful PCI of the proximal LAD with DES x 1 using IVUS guidance. Plan: DAPT with ASA for one month and Plavix for 12 months. Can resume oral anticoagulation tomorrow if no bleeding. Anticipate DC tomorrow.   ECHOCARDIOGRAM COMPLETE  Result Date: 09/26/2019    ECHOCARDIOGRAM REPORT   Patient Name:   Jatavious Peppard. Date of Exam: 09/26/2019 Medical Rec #:  [id number]         Height:       68.0 in  Accession #:    [id number]        Weight:       216.7 lb Date of Birth:  02/15/1946          BSA:          2.115 m Patient Age:    74 years          BP:           120/98 mmHg Patient Gender: M                 HR:  50 bpm. Exam Location:  Inpatient Procedure: 2D Echo, Cardiac Doppler and Color Doppler Indications:    Acute MI  History:        Patient has no prior history of Echocardiogram examinations.                 Acute MI, Previous Myocardial Infarction and CAD, Stroke,                 Signs/Symptoms:Chest Pain; Risk Factors:Hypertension, Diabetes                 and Obesity.  Sonographer:    Lavenia Atlas Referring Phys: [id number] ANTHONY PHILIP CARNICELLI IMPRESSIONS  1. The anteroseptal wall and apex are hypokinetic. Limited visualization of LV, difficult to assess LVEF. Recommend limited study with echocontrast to better evaluate function. . Left ventricular ejection fraction, by estimation, is 40 to 45%. The left ventricle has low normal function. Left ventricular endocardial border not optimally defined to evaluate regional wall motion. There is moderate left ventricular hypertrophy. Left ventricular diastolic parameters are consistent with Grade I diastolic dysfunction (impaired relaxation).  2. Right ventricular systolic function is normal. The right ventricular size is normal.  3. Left atrial size was severely dilated.  4. The mitral valve is normal in structure. Mild mitral valve regurgitation. No evidence of mitral stenosis.  5. The aortic valve is normal in structure. Aortic valve regurgitation is mild.  6. Aortic dilatation noted. There is mild dilatation of the aortic root measuring 40 mm. FINDINGS  Left Ventricle: The anteroseptal wall and apex are hypokinetic. Limited visualization of LV, difficult to assess LVEF. Recommend limited study with echocontrast to better evaluate function. Left ventricular ejection fraction, by estimation, is 40 to 45%. The left ventricle has low normal  function. Left ventricular endocardial border not optimally defined to evaluate regional wall motion. The left ventricular internal cavity size was normal in size. There is moderate left ventricular hypertrophy. Left ventricular diastolic parameters are consistent with Grade I diastolic dysfunction (impaired relaxation). Normal left ventricular filling pressure. Right Ventricle: The right ventricular size is normal. No increase in right ventricular wall thickness. Right ventricular systolic function is normal. Left Atrium: Left atrial size was severely dilated. Right Atrium: Right atrial size was not well visualized. Pericardium: There is no evidence of pericardial effusion. Mitral Valve: The mitral valve is normal in structure. There is mild thickening of the mitral valve leaflet(s). There is mild calcification of the mitral valve leaflet(s). Mild mitral annular calcification. Mild mitral valve regurgitation. No evidence of  mitral valve stenosis. Tricuspid Valve: The tricuspid valve is not well visualized. Tricuspid valve regurgitation is not demonstrated. No evidence of tricuspid stenosis. Aortic Valve: The aortic valve is normal in structure.. There is mild thickening and mild calcification of the aortic valve. Aortic valve regurgitation is mild. Aortic regurgitation PHT measures 715 msec. Mild aortic valve annular calcification. There is  mild thickening of the aortic valve. There is mild calcification of the aortic valve. Aortic valve mean gradient measures 3.6 mmHg. Aortic valve peak gradient measures 6.7 mmHg. Aortic valve area, by VTI measures 4.97 cm. Pulmonic Valve: The pulmonic valve was not well visualized. Pulmonic valve regurgitation is not visualized. No evidence of pulmonic stenosis. Aorta: Aortic dilatation noted. There is mild dilatation of the aortic root measuring 40 mm. Pulmonary Artery: Indeterminant PASP, inadequate TR jet. Venous: The inferior vena cava was not well visualized. IAS/Shunts:  The interatrial septum was not well visualized.  LEFT VENTRICLE PLAX 2D LVIDd:  4.93 cm  Diastology LVIDs:         3.81 cm  LV e' lateral:   6.31 cm/s LV PW:         1.43 cm  LV E/e' lateral: 10.0 LV IVS:        1.35 cm  LV e' medial:    4.24 cm/s LVOT diam:     2.40 cm  LV E/e' medial:  15.0 LV SV:         138 LV SV Index:   65 LVOT Area:     4.52 cm  RIGHT VENTRICLE RV Basal diam:  3.59 cm RV S prime:     13.00 cm/s TAPSE (M-mode): 3.1 cm LEFT ATRIUM             Index       RIGHT ATRIUM           Index LA diam:        3.90 cm 1.84 cm/m  RA Area:     22.60 cm LA Vol (A2C):   66.5 ml 31.45 ml/m RA Volume:   62.40 ml  29.51 ml/m LA Vol (A4C):   88.6 ml 41.90 ml/m LA Biplane Vol: 79.5 ml 37.60 ml/m  AORTIC VALVE AV Area (Vmax):    4.70 cm AV Area (Vmean):   3.74 cm AV Area (VTI):     4.97 cm AV Vmax:           129.10 cm/s AV Vmean:          89.100 cm/s AV VTI:            0.277 m AV Peak Grad:      6.7 mmHg AV Mean Grad:      3.6 mmHg LVOT Vmax:         134.00 cm/s LVOT Vmean:        73.600 cm/s LVOT VTI:          0.305 m LVOT/AV VTI ratio: 1.10 AI PHT:            715 msec  AORTA Ao Root diam: 4.00 cm MITRAL VALVE MV Area (PHT): 4.80 cm    SHUNTS MV Decel Time: 158 msec    Systemic VTI:  0.30 m MV E velocity: 63.40 cm/s  Systemic Diam: 2.40 cm MV A velocity: 75.80 cm/s MV E/A ratio:  0.84 Dina Rich MD Electronically signed by Dina Rich MD Signature Date/Time: 09/26/2019/3:45:05 PM    Final    ECHOCARDIOGRAM LIMITED  Result Date: 09/27/2019    ECHOCARDIOGRAM LIMITED REPORT   Patient Name:   Marco Gaughan. Date of Exam: 09/27/2019 Medical Rec #:  [id number]         Height:       68.0 in Accession #:    [id number]        Weight:       208.5 lb Date of Birth:  [birth year]          BSA:          2.080 m Patient Age:    74 years          BP:           124/91 mmHg Patient Gender: M                 HR:           74 bpm. Exam Location:  Inpatient Procedure: Limited Echo and Intracardiac  Opacification Agent Indications:    Chest  Pain 786.50 / R07.9  History:        Patient has prior history of Echocardiogram examinations, most                 recent 09/26/2019. CAD, Stroke; Risk Factors:Diabetes. Parkinson's                 disease (HCC).  Sonographer:    Leta Jungling RDCS Referring Phys: [id number] Dorothe Pea BRANCH IMPRESSIONS  1. Left ventricular ejection fraction, by estimation, is 45 to 50%. The left ventricle has mildly decreased function. The left ventricle demonstrates regional wall motion abnormalities (see scoring diagram/findings for description). There is moderate hypokinesis of the left ventricular, mid-apical anterior wall, anteroseptal wall and apical segment. FINDINGS  Left Ventricle: Left ventricular ejection fraction, by estimation, is 45 to 50%. The left ventricle has mildly decreased function. The left ventricle demonstrates regional wall motion abnormalities. Moderate hypokinesis of the left ventricular, mid-apical anterior wall, anteroseptal wall and apical segment. Definity contrast agent was given IV to delineate the left ventricular endocardial borders.   LV Volumes (MOD) LV vol d, MOD A2C: 94.4 ml LV vol d, MOD A4C: 156.0 ml LV vol s, MOD A2C: 50.0 ml LV vol s, MOD A4C: 77.0 ml LV SV MOD A2C:     44.4 ml LV SV MOD A4C:     156.0 ml LV SV MOD BP:      59.5 ml Zoila Shutter MD Electronically signed by Zoila Shutter MD Signature Date/Time: 09/27/2019/2:31:38 PM    Final    Disposition   Pt is being discharged home today in good condition.  Follow-up Plans & Appointments    Follow-up Information    Jodelle Gross, NP Follow up on 10/06/2019.   Specialties: Nurse Practitioner, Radiology, Cardiology Why: @ 10:45AM Contact information: [address] [place] [address] [phone]          Discharge Instructions    Amb Referral to Cardiac Rehabilitation   Complete by: As directed    To [place]   Diagnosis:  Coronary Stents NSTEMI PTCA       After initial evaluation and assessments completed: Virtual Based Care may be provided alone or in conjunction with Phase 2 Cardiac Rehab based on patient barriers.: Yes      Discharge Medications   Allergies as of 09/28/2019      Reactions   Lisinopril Cough   Nitroglycerin Nausea Only   Drop in blood pressure with vagally mediated nausea,diaphoresis with single dose of SL NG.       Medication List    STOP taking these medications   propranolol 20 MG tablet Commonly known as: INDERAL     TAKE these medications   acetaminophen 500 MG tablet Commonly known as: TYLENOL Take 1,000 mg by mouth in the morning and at bedtime.   ALPRAZolam 1 MG tablet Commonly known as: XANAX Take 0.5 mg by mouth 3 (three) times daily as needed for anxiety.   amLODipine 2.5 MG tablet Commonly known as: NORVASC Take 2.5 mg by mouth daily.   aspirin 81 MG tablet Take 81 mg by mouth daily.   atorvastatin 80 MG tablet Commonly known as: LIPITOR Take 1 tablet (80 mg total) by mouth daily. What changed:   medication strength  how much to take   buPROPion 300 MG 24 hr tablet Commonly known as: WELLBUTRIN XL Take 300 mg by mouth daily.   CARBIDOPA-LEVODOPA EN 25-100 mg by Enteral route in the morning and  at bedtime. Prescribed tid   clopidogrel 75 MG tablet Commonly known as: PLAVIX Take 1 tablet (75 mg total) by mouth daily with breakfast.   dabigatran 150 MG Caps capsule Commonly known as: PRADAXA Take 150 mg by mouth 2 (two) times daily.   gabapentin 600 MG tablet Commonly known as: NEURONTIN Take 600 mg by mouth at bedtime.   gabapentin 300 MG capsule Commonly known as: NEURONTIN Take 300 mg by mouth in the morning.   hydrochlorothiazide 25 MG tablet Commonly known as: HYDRODIURIL Take 1 tablet (25 mg total) by mouth daily.   HYDROcodone-acetaminophen 5-325 MG tablet Commonly known as: NORCO/VICODIN Take 1 tablet by mouth every 6 (six) hours as needed for pain.    irbesartan 300 MG tablet Commonly known as: AVAPRO Take 1 tablet (300 mg total) by mouth daily.   metoprolol tartrate 25 MG tablet Commonly known as: LOPRESSOR Take 0.5 tablets (12.5 mg total) by mouth 2 (two) times daily.   oxycodone 5 MG capsule Commonly known as: OXY-IR Take 5 mg by mouth every 4 (four) hours as needed for pain.   pantoprazole 40 MG tablet Commonly known as: PROTONIX Take 40 mg by mouth 2 (two) times daily.   zolpidem 10 MG tablet Commonly known as: AMBIEN Take 5 mg by mouth at bedtime as needed for sleep.          Outstanding Labs/Studies   None  Duration of Discharge Encounter   Greater than 30 minutes including physician time.  Signed, [place] David Stall, PA-C 09/28/2019, 10:15 AM

## 2019-10-01 ENCOUNTER — Telehealth (HOSPITAL_COMMUNITY): Payer: Self-pay

## 2019-10-01 NOTE — Telephone Encounter (Signed)
Faxed pt cardiac rehab referral to [facility] cardiac rehab per Phase I.

## 2019-10-05 NOTE — Progress Notes (Signed)
Cardiology Office Note   Date:  10/06/2019   ID:  [name]., DOB 09/11/1945, MRN [id number]  PCP:  Loyal Jacobson, MD  Cardiologist: To be established in [facility] with Dr. Servando Salina as new patient. CC: Hospital follow up   History of Present Illness: [name]. is a 74 y.o. male who presents for posthospitalization follow-up after admission for non-ST elevation MI requiring cardiac catheterization on 09/27/2019 revealing single-vessel coronary artery disease with 95% stenosis in the proximal LAD.  This appeared to be proximal to an old stent.  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  Mildly elevated LVEDP 19 mmHg.  Successful PCI of the prominent LAD with DES x1 using IVUS guidance.  The patient was started on dual antiplatelet therapy with aspirin and Plavix for 12 months.  [name] has a history of Parkinson disease, DVT on Pradaxa, CVA, CAD status post MI with PCI in 2002, hypertension, and diabetes.  She did present to the [facility] with intermittent chest pains that became more and more severe.  Echocardiogram revealed EF of 45% to 50% with echo contrast to better evaluate her function with contrast echo revealing 45 to 50% regional wall motion abnormalities.  He did have elevated lipid profile but LDL was 68 and HDL was 62.  On discharge he was continued on aspirin Plavix and Pradaxa, aspirin being discontinued in 1 month.  She was to continue with outpatient cardiac rehab.  She was discharged on Plavix 75 mg daily HCTZ 25 mg daily irbesartan 300 mg daily and Lopressor 12.5 mg twice daily.  He comes today with multiple questions, bringing a list, and also questions about his stent placement and location.  He also requests a return to work letter, information on being established at the [facility].  He denies any recurrent discomfort, he has occasional dizziness and mild fatigue.  His anginal equivalent was  jaw pain and fatigue.  He is an avid golfer and wishes to return to that as soon as possible.  He has 2 alcoholic drinks every night.  He is trying to reduce his cholesterol intake.  He is aware of his coronary artery disease as well as strong family history of premature coronary artery disease.   Past Medical History:  Diagnosis Date  . Coronary artery disease   . Diabetes mellitus   . DVT (deep venous thrombosis) (HCC)   . Hypertension   . Myocardial infarction (HCC)   . Parkinson's disease (HCC)   . Stroke [facility])     Past Surgical History:  Procedure Laterality Date  . CORONARY ANGIOPLASTY WITH STENT PLACEMENT    . CORONARY STENT INTERVENTION N/A 09/27/2019   Procedure: CORONARY STENT INTERVENTION;  Surgeon: Swaziland, Peter M, MD;  Location: [facility] INVASIVE CV LAB;  Service: Cardiovascular;  Laterality: N/A;  . INTRAVASCULAR ULTRASOUND/IVUS N/A 09/27/2019   Procedure: Intravascular Ultrasound/IVUS;  Surgeon: Swaziland, Peter M, MD;  Location: [facility] INVASIVE CV LAB;  Service: Cardiovascular;  Laterality: N/A;  . LEFT HEART CATH AND CORONARY ANGIOGRAPHY N/A 09/27/2019   Procedure: LEFT HEART CATH AND CORONARY ANGIOGRAPHY;  Surgeon: Swaziland, Peter M, MD;  Location: [facility] INVASIVE CV LAB;  Service: Cardiovascular;  Laterality: N/A;     Current Outpatient Medications  Medication Sig Dispense Refill  . acetaminophen (TYLENOL) 500 MG tablet Take 1,000 mg by mouth in the morning and at bedtime.    . ALPRAZolam (XANAX) 1 MG tablet Take  0.5 mg by mouth 3 (three) times daily as needed for anxiety.     Marland Kitchen amLODipine (NORVASC) 5 MG tablet Take 1 tablet (5 mg total) by mouth daily. 90 tablet 3  . atorvastatin (LIPITOR) 80 MG tablet Take 1 tablet (80 mg total) by mouth daily. 60 tablet 4  . buPROPion (WELLBUTRIN XL) 300 MG 24 hr tablet Take 300 mg by mouth daily.    Marland Kitchen CARBIDOPA-LEVODOPA EN 25-100 mg by Enteral route in the morning and at bedtime. Prescribed tid    . clopidogrel (PLAVIX) 75 MG tablet Take 1 tablet (75  mg total) by mouth daily with breakfast. 90 tablet 3  . dabigatran (PRADAXA) 150 MG CAPS capsule Take 150 mg by mouth 2 (two) times daily.    Marland Kitchen gabapentin (NEURONTIN) 300 MG capsule Take 300 mg by mouth in the morning.    . gabapentin (NEURONTIN) 600 MG tablet Take 600 mg by mouth at bedtime.    . hydrochlorothiazide (HYDRODIURIL) 25 MG tablet Take 1 tablet (25 mg total) by mouth daily. 30 tablet 6  . HYDROcodone-acetaminophen (NORCO/VICODIN) 5-325 MG tablet Take 1 tablet by mouth every 6 (six) hours as needed for pain.    Marland Kitchen irbesartan (AVAPRO) 300 MG tablet Take 1 tablet (300 mg total) by mouth daily. 30 tablet 6  . oxycodone (OXY-IR) 5 MG capsule Take 5 mg by mouth every 4 (four) hours as needed for pain.     . pantoprazole (PROTONIX) 40 MG tablet Take 40 mg by mouth 2 (two) times daily.     Marland Kitchen zolpidem (AMBIEN) 10 MG tablet Take 5 mg by mouth at bedtime as needed for sleep.     . nitroGLYCERIN (NITROSTAT) 0.4 MG SL tablet Place 1 tablet (0.4 mg total) under the tongue every 5 (five) minutes as needed for chest pain. 25 tablet 3   No current facility-administered medications for this visit.    Allergies:   Lisinopril and Nitroglycerin    Social History:  The patient  reports that he has never smoked. He has never used smokeless tobacco. He reports current alcohol use. He reports that he does not use drugs.   Family History:  The patient's family history is not on file.    ROS: All other systems are reviewed and negative. Unless otherwise mentioned in H&P    PHYSICAL EXAM: VS:  BP 132/70   Pulse (!) 42   Ht 5\' 8"  (1.727 m)   Wt 207 lb 6.4 oz (94.1 kg)   BMI 31.54 kg/m  , BMI Body mass index is 31.54 kg/m. GEN: Well nourished, well developed, in no acute distress HEENT: normal Neck: no JVD, carotid bruits, or masses Cardiac: RRR, bradycardic; no murmurs, rubs, or gallops,no edema  Respiratory:  Clear to auscultation bilaterally, normal work of breathing GI: soft, nontender,  nondistended, + BS MS: no deformity or atrophy Skin: warm and dry, no rash Neuro:  Strength and sensation are intact Psych: euthymic mood, full affect   EKG: Marked sinus bradycardia heart rate of 42 bpm with first-degree AV block, evidence of septal infarct.  Recent Labs: 09/28/2019: BUN 11; Creatinine, Ser 1.23; Hemoglobin 14.4; Platelets 184; Potassium 3.9; Sodium 139    Lipid Panel    Component Value Date/Time   CHOL 143 09/26/2019 0303   TRIG 63 09/26/2019 0303   HDL 62 09/26/2019 0303   CHOLHDL 2.3 09/26/2019 0303   VLDL 13 09/26/2019 0303   LDLCALC 68 09/26/2019 0303      Wt Readings from  Last 3 Encounters:  10/06/19 207 lb 6.4 oz (94.1 kg)  09/28/19 210 lb 4.8 oz (95.4 kg)  10/11/17 200 lb (90.7 kg)      Other studies Reviewed: LHC 09/27/2019  Mid Cx lesion is 60% stenosed.  Prox RCA lesion is 50% stenosed.  LV end diastolic pressure is mildly elevated.  Prox LAD to Mid LAD lesion is 20% stenosed.  Prox LAD lesion is 95% stenosed.  Post intervention, there is a 0% residual stenosis.  A drug-eluting stent was successfully placed using a STENT RESOLUTE ONYX 3.5X15.   1. Single vessel obstructive CAD with 95% stenosis in the proximal LAD. This appears to be proximal to old stent. 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  2. Mildly elevated LVEDP 19 mm Hg 3. Successful PCI of the proximal LAD with DES x 1 using IVUS guidance.  Plan: DAPT with ASA for one month and Plavix for 12 months. Can resume oral anticoagulation tomorrow if no bleeding. Anticipate DC tomorrow.    Echo 09/26/19 1. The anteroseptal wall and apex are hypokinetic. Limited visualization of LV, difficult to assess LVEF. Recommend limited study with echocontrast to better evaluate function. . Left ventricular ejection fraction, by estimation, is 40 to 45%. The left  ventricle has low normal function. Left ventricular endocardial border not optimally  defined to evaluate regional wall motion. There is moderate left ventricular hypertrophy. Left ventricular diastolic parameters are consistent with Grade I diastolic  dysfunction (impaired relaxation).  2. Right ventricular systolic function is normal. The right ventricular size is normal.  3. Left atrial size was severely dilated.  4. The mitral valve is normal in structure. Mild mitral valve regurgitation. No evidence of mitral stenosis.  5. The aortic valve is normal in structure. Aortic valve regurgitation is mild.  6. Aortic dilatation noted. 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   Echo 09/27/19 1. Left ventricular ejection fraction, by estimation, is 45 to 50%. The left ventricle has mildly decreased function. The left ventricle  demonstrates regional wall motion abnormalities (see scoring  diagram/findings for description). There is moderate  hypokinesis of the left ventricular, mid-apical anterior wall,  anteroseptal wall and apical segment.   FINDINGS  Left Ventricle: Left ventricular ejection fraction, by estimation, is 45 to 50%. The left ventricle has mildly decreased function. The left ventricle demonstrates regional wall motion abnormalities. Moderate hypokinesis of the left ventricular, mid-apical anterior wall, anteroseptal wall and apical segment. Definity contrast agent was given IV to delineate the left ventricular endocardial borders.    ASSESSMENT AND PLAN:  1.  Coronary artery disease: Status post cardiac catheterization in the setting of NSTEMI.  He was found to have a 95% proximal LAD lesion, with successful PCI of the proximal LAD with drug-eluting stent x1 using IVUS guidance.  I have answered multiple questions concerning his heart disease and cardiac catheterization.  I have given him a copy of the cardiac catheterization illustration and reviewed his coronary anatomy and location of stent placement.  He has been old made aware that on  October 28, 2019 he is to stop aspirin.  He is given refills of nitroglycerin and told to refill his nitroglycerin every 3 months whether he uses it or not as he is normally outside and keeps it in his pocket.   I have provided a return to work letter for October 11, 2019 without restrictions.  He has a medical courier.  He prefers to  follow-up at [facility], as this is closer to his home.  He will have a follow-up appointment with Dr. Tawanna Cooler at that location to be established with her as his cardiologist and for ongoing management of CAD.  Phone number and directions of that office are provided.  3.  Symptomatic bradycardia: He complains of fatigue and dizziness but no chest pain.  EKG reveals marked sinus bradycardia with rate of 42 bpm with a PR interval of 244 ms.  He is on metoprolol 12.5 mg daily.  I will discontinue this to allow for increased heart rate to avoid his symptoms.  Of note he has not had any near syncope that he can recall.  Follow-up EKG will be necessary on next appointment.  3  Hypercholesterolemia: He will continue high-dose atorvastatin 80 mg daily.  He will need to have fasting lipids and LFTs in 6 weeks to evaluate his response to medication.  He is counseled on low-cholesterol low-sodium diet  4.  Hypertension: Because I have discontinued metoprolol, I will increase amlodipine to 5 mg daily from 2.5 mg daily to maintain blood pressure control.  He will take his blood pressure daily and record along with heart rate until next appointment he will have close follow-up in 6 weeks and [place].   Current medicines are reviewed at length with the patient today.  I have spent 50 minutes dedicated to the care of this patient on the date of this encounter to include pre-visit review of records, assessment, management and diagnostic testing,with shared decision making.  Labs/ tests ordered today include: Fasting lipids and LFTs in 6 weeks  Bettey Mare. Liborio Nixon,  ANP, AACC   10/06/2019 12:32 PM    [facility] [address] Office [phone] Fax [phone]  Notice: This dictation was prepared with Dragon dictation along with smaller phrase technology. Any transcriptional errors that result from this process are unintentional and may not be corrected upon review.

## 2019-10-06 ENCOUNTER — Encounter: Payer: Self-pay | Admitting: Adult Health

## 2019-10-06 ENCOUNTER — Other Ambulatory Visit: Payer: Self-pay

## 2019-10-06 ENCOUNTER — Ambulatory Visit (INDEPENDENT_AMBULATORY_CARE_PROVIDER_SITE_OTHER): Payer: Medicare Other | Admitting: Adult Health

## 2019-10-06 VITALS — BP 132/70 | HR 42 | Ht 68.0 in | Wt 207.4 lb

## 2019-10-06 DIAGNOSIS — I2510 Atherosclerotic heart disease of native coronary artery without angina pectoris: Secondary | ICD-10-CM

## 2019-10-06 DIAGNOSIS — I249 Acute ischemic heart disease, unspecified: Secondary | ICD-10-CM

## 2019-10-06 DIAGNOSIS — R001 Bradycardia, unspecified: Secondary | ICD-10-CM | POA: Diagnosis not present

## 2019-10-06 DIAGNOSIS — E7800 Pure hypercholesterolemia, unspecified: Secondary | ICD-10-CM | POA: Diagnosis not present

## 2019-10-06 DIAGNOSIS — I10 Essential (primary) hypertension: Secondary | ICD-10-CM | POA: Diagnosis not present

## 2019-10-06 MED ORDER — AMLODIPINE BESYLATE 5 MG PO TABS: 5.0000 mg | ORAL_TABLET | Freq: Every day | ORAL | 3 refills | Status: AC

## 2019-10-06 MED ORDER — NITROGLYCERIN 0.4 MG SL SUBL: 0.4000 mg | SUBLINGUAL_TABLET | SUBLINGUAL | 3 refills | Status: AC | PRN

## 2019-10-06 NOTE — Patient Instructions (Signed)
Medication Instructions:  STOP- Metoprolol Tartrate STOP- Aspirin 81 mg on July 8th INCREASE- Amlodipine 5 mg by mouth daily  *If you need a refill on your cardiac medications before your next appointment, please call your pharmacy*   Lab Work: None Ordered   Testing/Procedures: None Ordered   Follow-Up: At [facility], you and your health needs are our priority.  As part of our continuing mission to provide you with exceptional heart care, we have created designated Provider Care Teams.  These Care Teams include your primary Cardiologist (physician) and Advanced Practice Providers (APPs -  Physician Assistants and Nurse Practitioners) who all work together to provide you with the care you need, when you need it.  We recommend signing up for the patient portal called "MyChart".  Sign up information is provided on this After Visit Summary.  MyChart is used to connect with patients for Virtual Visits (Telemedicine).  Patients are able to view lab/test results, encounter notes, upcoming appointments, etc.  Non-urgent messages can be sent to your provider as well.   To learn more about what you can do with MyChart, go to [email or web address].    Your next appointment:   6 Weeks with Dr Tawanna Cooler in [facility]  The format for your next appointment:   In Person  Provider:   [name], DO

## 2019-11-10 ENCOUNTER — Other Ambulatory Visit: Payer: Self-pay

## 2019-11-10 DIAGNOSIS — Z7409 Other reduced mobility: Secondary | ICD-10-CM

## 2019-11-10 HISTORY — DX: Other reduced mobility: Z74.09

## 2019-11-12 ENCOUNTER — Ambulatory Visit: Payer: Medicare Other | Admitting: Cardiology

## 2019-11-12 ENCOUNTER — Encounter: Payer: Self-pay | Admitting: Cardiology

## 2019-11-12 ENCOUNTER — Ambulatory Visit (INDEPENDENT_AMBULATORY_CARE_PROVIDER_SITE_OTHER): Payer: Medicare Other | Admitting: Cardiology

## 2019-11-12 ENCOUNTER — Other Ambulatory Visit: Payer: Self-pay

## 2019-11-12 VITALS — BP 114/86 | HR 84 | Ht 68.0 in | Wt 210.0 lb

## 2019-11-12 DIAGNOSIS — E119 Type 2 diabetes mellitus without complications: Secondary | ICD-10-CM

## 2019-11-12 DIAGNOSIS — R0989 Other specified symptoms and signs involving the circulatory and respiratory systems: Secondary | ICD-10-CM

## 2019-11-12 DIAGNOSIS — I2510 Atherosclerotic heart disease of native coronary artery without angina pectoris: Secondary | ICD-10-CM

## 2019-11-12 DIAGNOSIS — I10 Essential (primary) hypertension: Secondary | ICD-10-CM | POA: Diagnosis not present

## 2019-11-12 DIAGNOSIS — E782 Mixed hyperlipidemia: Secondary | ICD-10-CM | POA: Diagnosis not present

## 2019-11-12 DIAGNOSIS — I255 Ischemic cardiomyopathy: Secondary | ICD-10-CM

## 2019-11-12 DIAGNOSIS — D6859 Other primary thrombophilia: Secondary | ICD-10-CM

## 2019-11-12 DIAGNOSIS — E669 Obesity, unspecified: Secondary | ICD-10-CM

## 2019-11-12 NOTE — Progress Notes (Signed)
Cardiology Office Note:    Date:  11/13/2019   ID:  [name]., DOB [birth year], MRN [id number]  PCP:  Loyal Jacobson, MD  Cardiologist:  [name], DO  Electrophysiologist:  None   Referring MD: Loyal Jacobson, MD   " I am doing fine since the heart attack"  History of Present Illness:    [name]. is a 74 y.o. male with a hx of   Coronary artery disease status post MI with PCI in 2012, most recently on June 7 he had another PCI to the LAD however residual disease in RCA with a proximal RCA aneurysm, DVT on Pradaxa, Parkinson's disease, hypertension and diabetes.  His echocardiogram during his hospitalization for his MI showed  Ischemic cardiomyopathy with EF of 45% to 50%.      The patient is here today for follow-up visit. .  The patient was seen by Joni Reining, NP on  October 06, 2019 post hospitalization.    He tells me today he has been improving day by day.  He is back to work.  And does not appear that he is doing cardiac rehab- but he did not elaborate on this.  He denies any chest pain, shortness of breath, nausea, vomiting.  Past Medical History:  Diagnosis Date  . Acquired pes planus, left 06/02/2018  . ACS (acute coronary syndrome) (HCC) 09/25/2019  . Ankle weakness 10/23/2015  . Antiphospholipid antibody syndrome (HCC) 09/28/2016  . Arthritis of ankle, left 10/23/2015  . Atopic dermatitis 09/09/2016  . Benign essential tremor 07/04/2015  . Bradycardia 07/11/2015  . Cataract cortical, senile, bilateral 02/27/2016  . Chest pain 07/11/2015  . Chronic pain of right ankle 06/07/2016  . Coronary artery disease   . Coronary artery disease involving native coronary artery 07/11/2015  . Current use of long term anticoagulation 12/24/2013  . DDD (degenerative disc disease), cervical 07/16/2013   Formatting of this note might be different from the original. STORY: MRI C spine revealed multilevel DDD without disc protrution or impingement. Severe right facet arthritis C 2-3,  moderate severe right facet arthritis C7-T1.  Discussed with patient in legnth, he's not satisfied with the result of pain control. Will refer him to see Dr. Loletta Parish to consider facet block and will increase HCE to 10t  . Diabetes mellitus   . Diplopia 12/17/2016  . DVT (deep venous thrombosis) (HCC)   . DVT of leg (deep venous thrombosis) (HCC) 12/24/2013   Formatting of this note might be different from the original. Three times in left lower leg  . Erectile dysfunction 09/09/2016  . Essential hypertension 12/24/2013  . GERD (gastroesophageal reflux disease) 12/24/2013   Formatting of this note might be different from the original. Barrett's Esophagus with dysplasia, prior ablations at [facility] of this note might be different from the original. Overview:  Barrett's Esophagus with dysplasia, prior ablations at [facility]  . History of Barrett's esophagus 07/11/2015  . History of DVT (deep vein thrombosis) 07/11/2015  . History of MI (myocardial infarction) 04/21/2017  . Hypercoagulable state (HCC) 04/21/2017   Formatting of this note might be different from the original. History of anti-Cardiolite and antibody positive with DVT in 2002 following casting and 2008 following an MI by multiple mini strokes that occurred despite aspirin therapy for which she was switched to Coumadin with difficulty maintaining his INR and then complicated by his third DVT unprovoked  . Hyperlipidemia, unspecified 12/24/2013  . Hypertension   . Impaired functional mobility, balance, gait,  and endurance 11/10/2019  . Insomnia 07/04/2015  . Lumbar disc herniation with radiculopathy 10/17/2017  . Major depressive disorder, recurrent, in remission (HCC) 07/04/2015  . MI (myocardial infarction) (HCC) 12/24/2013   Formatting of this note might be different from the original. Nov 2002, 2 stents Guidant multi-link ZETA 1610960-45, [id number]  . Mild cognitive impairment 07/11/2015  . Myocardial infarction (HCC)   . Neck pain 07/16/2013    Formatting of this note might be different from the original. STORY: DDx:DDD, cervical spondylosis, facet arthropathy, radiculopathy.  . Nontraumatic rupture of left posterior tibial tendon 01/18/2016  . NSTEMI (non-ST elevated myocardial infarction) (HCC) 09/25/2019  . Nuclear sclerotic cataract of both eyes 02/27/2016  . Obesity (BMI 30-39.9) 07/04/2015  . Other specified anxiety disorders 04/21/2017  . Pain of upper abdomen 07/11/2015  . Parkinson disease (HCC) 04/03/2016  . Parkinson's disease (HCC)   . Personal history of transient ischemic attack (TIA), and cerebral infarction without residual deficits 12/24/2013   Formatting of this note might be different from the original. No residuals, Dec 2009, Feb 2010 and last 2012  . Right arm pain 06/03/2019  . Sacroiliitis (HCC) 02/25/2018  . Stroke (HCC)   . Tenosynovitis of right ankle 06/07/2016  . Tibialis posterior tendinitis, left 06/02/2018  . Type 2 diabetes mellitus without retinopathy (HCC) 12/24/2013   Formatting of this note might be different from the original. Diet controlled, A1C always around 5.9    Past Surgical History:  Procedure Laterality Date  . CORONARY ANGIOPLASTY WITH STENT PLACEMENT    . CORONARY STENT INTERVENTION N/A 09/27/2019   Procedure: CORONARY STENT INTERVENTION;  Surgeon: Swaziland, Peter M, MD;  Location: [facility] INVASIVE CV LAB;  Service: Cardiovascular;  Laterality: N/A;  . INTRAVASCULAR ULTRASOUND/IVUS N/A 09/27/2019   Procedure: Intravascular Ultrasound/IVUS;  Surgeon: Swaziland, Peter M, MD;  Location: [facility] INVASIVE CV LAB;  Service: Cardiovascular;  Laterality: N/A;  . LEFT HEART CATH AND CORONARY ANGIOGRAPHY N/A 09/27/2019   Procedure: LEFT HEART CATH AND CORONARY ANGIOGRAPHY;  Surgeon: Swaziland, Peter M, MD;  Location: [facility] INVASIVE CV LAB;  Service: Cardiovascular;  Laterality: N/A;    Current Medications: Current Meds  Medication Sig  . acetaminophen (TYLENOL) 500 MG tablet Take 1,000 mg by mouth in the morning and at  bedtime.  . ALPRAZolam (XANAX) 1 MG tablet Take 0.5 mg by mouth 3 (three) times daily as needed for anxiety.   Marland Kitchen amLODipine (NORVASC) 5 MG tablet Take 1 tablet (5 mg total) by mouth daily.  Marland Kitchen atorvastatin (LIPITOR) 80 MG tablet Take 1 tablet (80 mg total) by mouth daily.  Marland Kitchen buPROPion (WELLBUTRIN XL) 300 MG 24 hr tablet Take 300 mg by mouth daily.  . carbidopa-levodopa (SINEMET IR) 25-100 MG tablet Take 1.5 tablets by mouth 3 (three) times daily. As directed  . clopidogrel (PLAVIX) 75 MG tablet Take 1 tablet (75 mg total) by mouth daily with breakfast.  . co-enzyme Q-10 30 MG capsule Take 30 mg by mouth daily.  . dabigatran (PRADAXA) 150 MG CAPS capsule Take 150 mg by mouth 2 (two) times daily.  Marland Kitchen gabapentin (NEURONTIN) 400 MG capsule Take by mouth in the morning.   . gabapentin (NEURONTIN) 600 MG tablet Take 600 mg by mouth at bedtime.  . hydrochlorothiazide (HYDRODIURIL) 25 MG tablet Take 1 tablet (25 mg total) by mouth daily.  Marland Kitchen HYDROcodone-acetaminophen (NORCO/VICODIN) 5-325 MG tablet Take 1 tablet by mouth every 6 (six) hours as needed for pain.  Marland Kitchen irbesartan (AVAPRO) 300 MG tablet Take 1 tablet (  300 mg total) by mouth daily.  . nitroGLYCERIN (NITROSTAT) 0.4 MG SL tablet Place 1 tablet (0.4 mg total) under the tongue every 5 (five) minutes as needed for chest pain.  Marland Kitchen oxycodone (OXY-IR) 5 MG capsule Take 5 mg by mouth every 4 (four) hours as needed for pain.   . pantoprazole (PROTONIX) 40 MG tablet Take 40 mg by mouth 2 (two) times daily.   Marland Kitchen zolpidem (AMBIEN) 10 MG tablet Take 5 mg by mouth at bedtime as needed for sleep.      Allergies:   Lisinopril and Nitroglycerin   Social History   Socioeconomic History  . Marital status: Married    Spouse name: Not on file  . Number of children: Not on file  . Years of education: Not on file  . Highest education level: Not on file  Occupational History  . Not on file  Tobacco Use  . Smoking status: Never Smoker  . Smokeless tobacco: Never  Used  Vaping Use  . Vaping Use: Never used  Substance and Sexual Activity  . Alcohol use: Yes    Alcohol/week: 2.0 standard drinks    Types: 2 Standard drinks or equivalent per week    Comment: daily  . Drug use: No  . Sexual activity: Yes  Other Topics Concern  . Not on file  Social History Narrative  . Not on file   Social Determinants of Health   Financial Resource Strain:   . Difficulty of Paying Living Expenses:   Food Insecurity:   . Worried About Programme researcher, broadcasting/film/video in the Last Year:   . Barista in the Last Year:   Transportation Needs:   . Freight forwarder (Medical):   Marland Kitchen Lack of Transportation (Non-Medical):   Physical Activity:   . Days of Exercise per Week:   . Minutes of Exercise per Session:   Stress:   . Feeling of Stress :   Social Connections:   . Frequency of Communication with Friends and Family:   . Frequency of Social Gatherings with Friends and Family:   . Attends Religious Services:   . Active Member of Clubs or Organizations:   . Attends Banker Meetings:   Marland Kitchen Marital Status:      Family History: The patient's family history includes CAD (age of onset: 52) in his father; CAD (age of onset: 57) in his brother.  ROS:   Review of Systems  Constitution: Negative for decreased appetite, fever and weight gain.  HENT: Negative for congestion, ear discharge, hoarse voice and sore throat.   Eyes: Negative for discharge, redness, vision loss in right eye and visual halos.  Cardiovascular: Negative for chest pain, dyspnea on exertion, leg swelling, orthopnea and palpitations.  Respiratory: Negative for cough, hemoptysis, shortness of breath and snoring.   Endocrine: Negative for heat intolerance and polyphagia.  Hematologic/Lymphatic: Negative for bleeding problem. Does not bruise/bleed easily.  Skin: Negative for flushing, nail changes, rash and suspicious lesions.  Musculoskeletal: Negative for arthritis, joint pain, muscle  cramps, myalgias, neck pain and stiffness.  Gastrointestinal: Negative for abdominal pain, bowel incontinence, diarrhea and excessive appetite.  Genitourinary: Negative for decreased libido, genital sores and incomplete emptying.  Neurological: Negative for brief paralysis, focal weakness, headaches and loss of balance.  Psychiatric/Behavioral: Negative for altered mental status, depression and suicidal ideas.  Allergic/Immunologic: Negative for HIV exposure and persistent infections.    EKGs/Labs/Other Studies Reviewed:    The following studies were reviewed today:  EKG:    None today.  His EKG performed 10/07/2019 was reviewed.   LHC 09/27/2019  Mid Cx lesion is 60% stenosed.  Prox RCA lesion is 50% stenosed.  LV end diastolic pressure is mildly elevated.  Prox LAD to Mid LAD lesion is 20% stenosed.  Prox LAD lesion is 95% stenosed.  Post intervention, there is a 0% residual stenosis.  A drug-eluting stent was successfully placed using a STENT RESOLUTE ONYX 3.5X15.  1. Single vessel obstructive CAD with 95% stenosis in the proximal LAD. This appears to be proximal to old stent. 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  2. Mildly elevated LVEDP 19 mm Hg 3. Successful PCI of the proximal LAD with DES x 1 using IVUS guidance.  Plan: DAPT with ASA for one month and Plavix for 12 months. Can resume oral anticoagulation tomorrow if no bleeding. Anticipate DC tomorrow.   TTE IMPRESSIONS  1. Left ventricular ejection fraction, by estimation, is 45 to 50%. The  left ventricle has mildly decreased function. The left ventricle  demonstrates regional wall motion abnormalities (see scoring  diagram/findings for description). There is moderate  hypokinesis of the left ventricular, mid-apical anterior wall,  anteroseptal wall and apical segment.   Recent Labs: 09/28/2019: BUN 11; Creatinine, Ser 1.23; Hemoglobin 14.4; Platelets 184; Potassium  3.9; Sodium 139  Recent Lipid Panel    Component Value Date/Time   CHOL 143 09/26/2019 0303   TRIG 63 09/26/2019 0303   HDL 62 09/26/2019 0303   CHOLHDL 2.3 09/26/2019 0303   VLDL 13 09/26/2019 0303   LDLCALC 68 09/26/2019 0303    Physical Exam:    VS:  BP (!) 114/86 (BP Location: Right Arm, Patient Position: Sitting)   Pulse 84   Ht 5\' 8"  (1.727 m)   Wt (!) 210 lb (95.3 kg)   SpO2 94%   BMI 31.93 kg/m     Wt Readings from Last 3 Encounters:  11/12/19 (!) 210 lb (95.3 kg)  10/06/19 207 lb 6.4 oz (94.1 kg)  09/28/19 210 lb 4.8 oz (95.4 kg)     GEN: Well nourished, well developed in no acute distress HEENT: Normal NECK: No JVD; No carotid bruits LYMPHATICS: No lymphadenopathy CARDIAC: S1S2 noted,RRR, no murmurs, rubs, gallops RESPIRATORY:  Clear to auscultation without rales, wheezing or rhonchi  ABDOMEN: Soft, non-tender, non-distended, +bowel sounds, no guarding. EXTREMITIES: No edema, No cyanosis, no clubbing MUSCULOSKELETAL:  No deformity  SKIN: Warm and dry NEUROLOGIC:  Alert and oriented x 3, non-focal PSYCHIATRIC:  Normal affect, good insight  ASSESSMENT:    1. Coronary artery disease involving native coronary artery of native heart without angina pectoris   2. Essential hypertension   3. Type 2 diabetes mellitus without retinopathy (HCC)   4. Mixed hyperlipidemia   5. Hypercoagulable state (HCC)   6. Obesity (BMI 30-39.9)   7. Ischemic cardiomyopathy   8. Depressed left ventricular ejection fraction    PLAN:       During his visit the patient has several questions about his residual coronary artery disease as well as what it meant to have her coronary artery aneurysm.  I was able to explained and answered the patient questions to his satisfaction.  He also had more questions about his medication regimen which also I was able to answered to his satisfaction.   he is now off triple therapy and is on Plavix along with his Pradaxa.  He will also continue his  atorvastatin 80 mg  daily I review his lipid profile while he was in hospital his LDL is less than 70 so we keep him on this regimen for now.    His blood pressure appears to be acceptable in the office he denies any lightheadedness or dizziness at this time will continue his current medication.  He tells me he has been off beta-blockers and he was told by his neurologist to really not take beta-blocker due to his Parkinson's disease.    In terms of ischemic cardiomyopathy I discussed with the patient his EF as well as when will be appropriate time to repeat an echocardiogram to see if this any improvement.  He does not want to pursue cardiac rehab as he tells me he is very busy with his work I did encourage the patient to increase his exercise as well as improve his diet  The patient is in agreement with the above plan. The patient left the office in stable condition.  The patient will follow up in 6 months or sooner if needed.   Medication Adjustments/Labs and Tests Ordered: Current medicines are reviewed at length with the patient today.  Concerns regarding medicines are outlined above.  No orders of the defined types were placed in this encounter.  No orders of the defined types were placed in this encounter.   Patient Instructions  Medication Instructions:  Your physician recommends that you continue on your current medications as directed. Please refer to the Current Medication list given to you today.  *If you need a refill on your cardiac medications before your next appointment, please call your pharmacy*   Lab Work: None If you have labs (blood work) drawn today and your tests are completely normal, you will receive your results only by: Marland Kitchen MyChart Message (if you have MyChart) OR . A paper copy in the mail If you have any lab test that is abnormal or we need to change your treatment, we will call you to review the results.   Testing/Procedures: None   Follow-Up: At [facility], you and your health needs are our priority.  As part of our continuing mission to provide you with exceptional heart care, we have created designated Provider Care Teams.  These Care Teams include your primary Cardiologist (physician) and Advanced Practice Providers (APPs -  Physician Assistants and Nurse Practitioners) who all work together to provide you with the care you need, when you need it.  We recommend signing up for the patient portal called "MyChart".  Sign up information is provided on this After Visit Summary.  MyChart is used to connect with patients for Virtual Visits (Telemedicine).  Patients are able to view lab/test results, encounter notes, upcoming appointments, etc.  Non-urgent messages can be sent to your provider as well.   To learn more about what you can do with MyChart, go to [email or web address].    Your next appointment:   6 month(s)  The format for your next appointment:   In Person  Provider:   [name], DO   Other Instructions      Adopting a Healthy Lifestyle.  Know what a healthy weight is for you (roughly BMI <25) and aim to maintain this   Aim for 7+ servings of fruits and vegetables daily   65-80+ fluid ounces of water or unsweet tea for healthy kidneys   Limit to max 1 drink of alcohol per day; avoid smoking/tobacco   Limit animal fats in diet for cholesterol and heart health - choose grass  fed whenever available   Avoid highly processed foods, and foods high in saturated/trans fats   Aim for low stress - take time to unwind and care for your mental health   Aim for 150 min of moderate intensity exercise weekly for heart health, and weights twice weekly for bone health   Aim for 7-9 hours of sleep daily   When it comes to diets, agreement about the perfect plan isnt easy to find, even among the experts. Experts at the [facility] [employer] developed an idea known as the Healthy Eating Plate. Just imagine a plate  divided into logical, healthy portions.   The emphasis is on diet quality:   Load up on vegetables and fruits - one-half of your plate: Aim for color and variety, and remember that potatoes dont count.   Go for whole grains - one-quarter of your plate: Whole wheat, barley, wheat berries, quinoa, oats, brown rice, and foods made with them. If you want pasta, go with whole wheat pasta.   Protein power - one-quarter of your plate: Fish, chicken, beans, and nuts are all healthy, versatile protein sources. Limit red meat.   The diet, however, does go beyond the plate, offering a few other suggestions.   Use healthy plant oils, such as olive, canola, soy, corn, sunflower and peanut. Check the labels, and avoid partially hydrogenated oil, which have unhealthy trans fats.   If youre thirsty, drink water. Coffee and tea are good in moderation, but skip sugary drinks and limit milk and dairy products to one or two daily servings.   The type of carbohydrate in the diet is more important than the amount. Some sources of carbohydrates, such as vegetables, fruits, whole grains, and beans-are healthier than others.   Finally, stay active  Signed, [name], DO  11/13/2019 12:56 PM    [facility]

## 2019-11-12 NOTE — Patient Instructions (Signed)

## 2019-11-13 DIAGNOSIS — R0989 Other specified symptoms and signs involving the circulatory and respiratory systems: Secondary | ICD-10-CM | POA: Insufficient documentation

## 2019-11-13 DIAGNOSIS — I255 Ischemic cardiomyopathy: Secondary | ICD-10-CM | POA: Insufficient documentation

## 2020-02-15 ENCOUNTER — Other Ambulatory Visit: Payer: Self-pay

## 2020-02-15 MED ORDER — CLOPIDOGREL BISULFATE 75 MG PO TABS: 75.0000 mg | ORAL_TABLET | Freq: Every day | ORAL | 2 refills | Status: AC

## 2020-02-15 MED ORDER — HYDROCHLOROTHIAZIDE 25 MG PO TABS: 25.0000 mg | ORAL_TABLET | Freq: Every day | ORAL | 2 refills | Status: DC

## 2020-02-26 ENCOUNTER — Emergency Department (HOSPITAL_BASED_OUTPATIENT_CLINIC_OR_DEPARTMENT_OTHER)
Admission: EM | Admit: 2020-02-26 | Discharge: 2020-02-26 | Disposition: A | Discharge status: Home / Self Care | Payer: Medicare Other | Attending: Emergency Medicine | Admitting: Emergency Medicine

## 2020-02-26 ENCOUNTER — Encounter (HOSPITAL_BASED_OUTPATIENT_CLINIC_OR_DEPARTMENT_OTHER): Payer: Self-pay | Admitting: Emergency Medicine

## 2020-02-26 ENCOUNTER — Emergency Department (HOSPITAL_BASED_OUTPATIENT_CLINIC_OR_DEPARTMENT_OTHER): Payer: Medicare Other

## 2020-02-26 ENCOUNTER — Other Ambulatory Visit: Payer: Self-pay

## 2020-02-26 ENCOUNTER — Telehealth (HOSPITAL_COMMUNITY): Payer: Self-pay | Admitting: Family

## 2020-02-26 DIAGNOSIS — Z79899 Other long term (current) drug therapy: Secondary | ICD-10-CM | POA: Insufficient documentation

## 2020-02-26 DIAGNOSIS — Z7901 Long term (current) use of anticoagulants: Secondary | ICD-10-CM | POA: Diagnosis not present

## 2020-02-26 DIAGNOSIS — I10 Essential (primary) hypertension: Secondary | ICD-10-CM | POA: Insufficient documentation

## 2020-02-26 DIAGNOSIS — E11319 Type 2 diabetes mellitus with unspecified diabetic retinopathy without macular edema: Secondary | ICD-10-CM | POA: Diagnosis not present

## 2020-02-26 DIAGNOSIS — U071 COVID-19: Secondary | ICD-10-CM | POA: Diagnosis not present

## 2020-02-26 DIAGNOSIS — G20 Parkinson's disease: Secondary | ICD-10-CM | POA: Diagnosis not present

## 2020-02-26 DIAGNOSIS — R059 Cough, unspecified: Secondary | ICD-10-CM | POA: Diagnosis present

## 2020-02-26 DIAGNOSIS — R0602 Shortness of breath: Secondary | ICD-10-CM | POA: Diagnosis not present

## 2020-02-26 DIAGNOSIS — I2510 Atherosclerotic heart disease of native coronary artery without angina pectoris: Secondary | ICD-10-CM | POA: Diagnosis not present

## 2020-02-26 LAB — BASIC METABOLIC PANEL
Anion gap: 11 (ref 5–15)
BUN: 24 mg/dL — ABNORMAL HIGH (ref 8–23)
CO2: 23 mmol/L (ref 22–32)
Calcium: 8.5 mg/dL — ABNORMAL LOW (ref 8.9–10.3)
Chloride: 100 mmol/L (ref 98–111)
Creatinine, Ser: 1.16 mg/dL (ref 0.61–1.24)
GFR, Estimated: >60 mL/min (ref >60)
Glucose, Bld: 106 mg/dL — ABNORMAL HIGH (ref 70–99)
Potassium: 3.8 mmol/L (ref 3.5–5.1)
Sodium: 134 mmol/L — ABNORMAL LOW (ref 135–145)

## 2020-02-26 LAB — CBC WITH DIFFERENTIAL/PLATELET
Abs Immature Granulocytes: 0.02 10*3/uL (ref 0.00–0.07)
Basophils Absolute: 0 10*3/uL (ref 0.0–0.1)
Basophils Relative: 1 %
Eosinophils Absolute: 0.1 10*3/uL (ref 0.0–0.5)
Eosinophils Relative: 1 %
HCT: 46 % (ref 39.0–52.0)
Hemoglobin: 15.6 g/dL (ref 13.0–17.0)
Immature Granulocytes: 0 %
Lymphocytes Relative: 19 %
Lymphs Abs: 1.3 10*3/uL (ref 0.7–4.0)
MCH: 31.5 pg (ref 26.0–34.0)
MCHC: 33.9 g/dL (ref 30.0–36.0)
MCV: 92.9 fL (ref 80.0–100.0)
Monocytes Absolute: 1 10*3/uL (ref 0.1–1.0)
Monocytes Relative: 16 %
Neutro Abs: 4.1 10*3/uL (ref 1.7–7.7)
Neutrophils Relative %: 63 %
Platelets: 196 10*3/uL (ref 150–400)
RBC: 4.95 MIL/uL (ref 4.22–5.81)
RDW: 13.2 % (ref 11.5–15.5)
WBC: 6.5 10*3/uL (ref 4.0–10.5)
nRBC: 0 % (ref 0.0–0.2)

## 2020-02-26 LAB — BRAIN NATRIURETIC PEPTIDE: B Natriuretic Peptide: 42.4 pg/mL (ref 0.0–100.0)

## 2020-02-26 LAB — RESPIRATORY PANEL BY RT PCR (FLU A&B, COVID)
Influenza A by PCR: NEGATIVE
Influenza B by PCR: NEGATIVE
SARS Coronavirus 2 by RT PCR: POSITIVE — AB

## 2020-02-26 LAB — TROPONIN I (HIGH SENSITIVITY): Troponin I (High Sensitivity): 6 ng/L (ref <18)

## 2020-02-26 NOTE — ED Provider Notes (Signed)
[facility] EMERGENCY DEPARTMENT Provider Note  CSN: [id number] Arrival date & time: 02/26/20 1108    History Chief Complaint  Patient presents with   Cough    HPI  Christian Coopersmith. is a 74 y.o. male who presents to the ED for evaluation of cough. He reports 2 days ago he began having nasal congestion, sore throat and dry cough associated with body aches but no fever. He reports yesterday he woke up and felt like he had bricks on his chest, but this is a different pain than then he had an NSTEMI about 4-5 months ago. No chest pain since but he reports some muscle aches in his back. He has been vaccinated for Covid. Received his flu vaccine about 2 days prior to these symptoms starting.    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 STORY: MRI C spine revealed multilevel DDD without disc protrution or impingement. Severe right facet arthritis C 2-3, moderate severe right facet arthritis C7-T1.  Discussed with patient in legnth, he's not satisfied with the result of pain control. Will refer him to see Dr. Loletta Parish to consider facet block and will increase HCE to 10t   Diabetes mellitus    Diplopia 12/17/2016   DVT (deep venous thrombosis) (HCC)    DVT of leg (deep venous thrombosis) (HCC) 12/24/2013    Formatting of this note might be different from the original. Three times in left lower leg   Erectile dysfunction 09/09/2016   Essential hypertension 12/24/2013   GERD (gastroesophageal reflux disease) 12/24/2013   Formatting of this note might be different from the original. Barrett's Esophagus with dysplasia, prior ablations at [facility] of this note might be different from the original. Overview:  Barrett's Esophagus with dysplasia, prior ablations at [facility]   History of Barrett's esophagus 07/11/2015   History of DVT (deep vein thrombosis) 07/11/2015   History of MI (myocardial infarction) 04/21/2017   Hypercoagulable state (HCC) 04/21/2017   Formatting of this note might be different from the original. History of anti-Cardiolite and antibody positive with DVT in 2002 following casting and 2008 following an MI by multiple mini strokes that occurred despite aspirin therapy for which she was switched to Coumadin with difficulty maintaining his INR and then complicated by his third DVT unprovoked   Hyperlipidemia, unspecified 12/24/2013   Hypertension    Impaired functional mobility, balance, gait, and endurance 11/10/2019   Insomnia 07/04/2015   Lumbar disc herniation with radiculopathy 10/17/2017   Major depressive disorder, recurrent, in remission (HCC) 07/04/2015   MI (myocardial infarction) (HCC) 12/24/2013   Formatting of this note might be different from the original. Nov 2002, 2 stents Guidant multi-link ZETA [id number], [id number]   Mild cognitive impairment 07/11/2015   Myocardial infarction [facility])    Neck pain 07/16/2013   Formatting of this note might  be different from the original. STORY: DDx:DDD, cervical spondylosis, facet arthropathy, radiculopathy.   Nontraumatic rupture of left posterior tibial tendon 01/18/2016   NSTEMI (non-ST elevated myocardial infarction) (HCC) 09/25/2019   Nuclear sclerotic cataract of both eyes 02/27/2016   Obesity (BMI 30-39.9) 07/04/2015    Other specified anxiety disorders 04/21/2017   Pain of upper abdomen 07/11/2015   Parkinson disease (HCC) 04/03/2016   Parkinson's disease (HCC)    Personal history of transient ischemic attack (TIA), and cerebral infarction without residual deficits 12/24/2013   Formatting of this note might be different from the original. No residuals, Dec 2009, Feb 2010 and last 2012   Right arm pain 06/03/2019   Sacroiliitis (HCC) 02/25/2018   Stroke (HCC)    Tenosynovitis of right ankle 06/07/2016   Tibialis posterior tendinitis, left 06/02/2018   Type 2 diabetes mellitus without retinopathy (HCC) 12/24/2013   Formatting of this note might be different from the original. Diet controlled, A1C always around 5.9    Past Surgical History:  Procedure Laterality Date   CORONARY ANGIOPLASTY WITH STENT PLACEMENT     CORONARY STENT INTERVENTION N/A 09/27/2019   Procedure: CORONARY STENT INTERVENTION;  Surgeon: Swaziland, Peter M, MD;  Location: [facility] INVASIVE CV LAB;  Service: Cardiovascular;  Laterality: N/A;   INTRAVASCULAR ULTRASOUND/IVUS N/A 09/27/2019   Procedure: Intravascular Ultrasound/IVUS;  Surgeon: Swaziland, Peter M, MD;  Location: [facility] INVASIVE CV LAB;  Service: Cardiovascular;  Laterality: N/A;   LEFT HEART CATH AND CORONARY ANGIOGRAPHY N/A 09/27/2019   Procedure: LEFT HEART CATH AND CORONARY ANGIOGRAPHY;  Surgeon: Swaziland, Peter M, MD;  Location: [facility] INVASIVE CV LAB;  Service: Cardiovascular;  Laterality: N/A;    Family History  Problem Relation Age of Onset   CAD Father 18       Died of MI   CAD Brother 57       MI    Social History   Tobacco Use   Smoking status: Never Smoker   Smokeless tobacco: Never Used  Vaping Use   Vaping Use: Never used  Substance Use Topics   Alcohol use: Yes    Alcohol/week: 2.0 standard drinks    Types: 2 Standard drinks or equivalent per week    Comment: daily   Drug use: No     Home Medications Prior to Admission medications   Medication Sig Start  Date End Date Taking? Authorizing Provider  acetaminophen (TYLENOL) 500 MG tablet Take 1,000 mg by mouth in the morning and at bedtime.    [provider]  ALPRAZolam Prudy Feeler) 1 MG tablet Take 0.5 mg by mouth 3 (three) times daily as needed for anxiety.     [provider]  amLODipine (NORVASC) 5 MG tablet Take 1 tablet (5 mg total) by mouth daily. 10/06/19   Jodelle Gross, NP  atorvastatin (LIPITOR) 80 MG tablet Take 1 tablet (80 mg total) by mouth daily. 09/28/19   Furth, Cadence H, PA-C  buPROPion (WELLBUTRIN XL) 300 MG 24 hr tablet Take 300 mg by mouth daily.    [provider]  carbidopa-levodopa (SINEMET IR) 25-100 MG tablet Take 1.5 tablets by mouth 3 (three) times daily. As directed 11/05/19   [provider]  clopidogrel (PLAVIX) 75 MG tablet Take 1 tablet (75 mg total) by mouth daily with breakfast. 02/15/20   Tobb, Kardie, DO  co-enzyme Q-10 30 MG capsule Take 30 mg by mouth daily.    [provider]  dabigatran (PRADAXA) 150 MG CAPS capsule Take 150 mg by  mouth 2 (two) times daily.    [provider]  gabapentin (NEURONTIN) 400 MG capsule Take by mouth in the morning.     [provider]  gabapentin (NEURONTIN) 600 MG tablet Take 600 mg by mouth at bedtime.    [provider]  hydrochlorothiazide (HYDRODIURIL) 25 MG tablet Take 1 tablet (25 mg total) by mouth daily. 02/15/20   Tobb, Kardie, DO  HYDROcodone-acetaminophen (NORCO/VICODIN) 5-325 MG tablet Take 1 tablet by mouth every 6 (six) hours as needed for pain. 09/01/19   [provider]  irbesartan (AVAPRO) 300 MG tablet Take 1 tablet (300 mg total) by mouth daily. 09/28/19   Furth, Cadence H, PA-C  nitroGLYCERIN (NITROSTAT) 0.4 MG SL tablet Place 1 tablet (0.4 mg total) under the tongue every 5 (five) minutes as needed for chest pain. 10/06/19 01/04/20  Jodelle Gross, NP  oxycodone (OXY-IR) 5 MG capsule Take 5 mg by mouth every 4 (four) hours as needed  for pain.     [provider]  pantoprazole (PROTONIX) 40 MG tablet Take 40 mg by mouth 2 (two) times daily.     [provider]  zolpidem (AMBIEN) 10 MG tablet Take 5 mg by mouth at bedtime as needed for sleep.     [provider]     Allergies    Lisinopril and Nitroglycerin   Review of Systems   Review of Systems A comprehensive review of systems was completed and negative except as noted in HPI.    Physical Exam BP 103/77 (BP Location: Left Arm)    Pulse 87    Temp 98.2 F (36.8 C) (Oral)    Resp 18    Ht 5\' 8"  (1.727 m)    Wt 95.3 kg    SpO2 97%    BMI 31.93 kg/m   Physical Exam Vitals and nursing note reviewed.  Constitutional:      Appearance: Normal appearance.  HENT:     Head: Normocephalic and atraumatic.     Nose: Nose normal.     Mouth/Throat:     Mouth: Mucous membranes are moist.  Eyes:     Extraocular Movements: Extraocular movements intact.     Conjunctiva/sclera: Conjunctivae normal.  Cardiovascular:     Rate and Rhythm: Normal rate.  Pulmonary:     Effort: Pulmonary effort is normal.     Breath sounds: Normal breath sounds.  Abdominal:     General: Abdomen is flat.     Palpations: Abdomen is soft.     Tenderness: There is no abdominal tenderness.  Musculoskeletal:        General: No swelling. Normal range of motion.     Cervical back: Neck supple.  Skin:    General: Skin is warm and dry.  Neurological:     General: No focal deficit present.     Mental Status: He is alert.  Psychiatric:        Mood and Affect: Mood normal.      ED Results / Procedures / Treatments   Labs (all labs ordered are listed, but only abnormal results are displayed) Labs Reviewed  RESPIRATORY PANEL BY RT PCR (FLU A&B, COVID) - Abnormal; Notable for the following components:      Result Value   SARS Coronavirus 2 by RT PCR POSITIVE (*)    All other components within normal limits  BASIC METABOLIC PANEL - Abnormal; Notable for the  following components:   Sodium 134 (*)    Glucose, Bld 106 (*)  BUN 24 (*)    Calcium 8.5 (*)    All other components within normal limits  BRAIN NATRIURETIC PEPTIDE  CBC WITH DIFFERENTIAL/PLATELET  TROPONIN I (HIGH SENSITIVITY)  TROPONIN I (HIGH SENSITIVITY)    EKG EKG Interpretation  Date/Time:  Saturday February 26 2020 12:38:42 EDT Ventricular Rate:  70 PR Interval:    QRS Duration: 95 QT Interval:  371 QTC Calculation: 401 R Axis:   -42 Text Interpretation: Sinus rhythm Left anterior fascicular block Low voltage, precordial leads Anteroseptal infarct, old No significant change since last tracing Confirmed by [name] [phone]) on 02/26/2020 12:51:02 PM    Radiology DG Chest Port 1 View  Result Date: 02/26/2020 CLINICAL DATA:  74 year old male with history of cough, shortness of breath and chest pain. EXAM: PORTABLE CHEST 1 VIEW COMPARISON:  Chest x-ray 09/25/2019. FINDINGS: Lung volumes are low. No consolidative airspace disease. No pleural effusions. No pneumothorax. No pulmonary nodule or mass noted. Pulmonary vasculature and the cardiomediastinal silhouette are within normal limits allowing for patient rotation to the left. IMPRESSION: 1. Low lung volumes without radiographic evidence of acute cardiopulmonary disease. Electronically Signed   By: Trudie Reed M.D.   On: 02/26/2020 12:57    Procedures Procedures  Medications Ordered in the ED Medications - No data to display   MDM Rules/Calculators/A&P MDM Patient with URI symptoms, will check for Covid. Given his history and reports of chest pain yesterday will add EKG and Trop.  ED Course  I have reviewed the triage vital signs and the nursing notes.  Pertinent labs & imaging results that were available during my care of the patient were reviewed by me and considered in my medical decision making (see chart for details).  Clinical Course as of Feb 25 1333  Sat Feb 26, 2020  1224 CBC is normal.     [CS]  1250 BMP is normal.    [CS]  1254 Trop and BNP are normal. Chest pain was >24hours ago. Repeat is not necessary.    [CS]  1321 Covid is positive. Patient does not have any significant CXR abnormalities and SpO2 is normal on room air. Will refer for MAB infusion. Given quarantine instructions. Recommend he get a home pulse oximeter and return if he gets hypoxic. PCP follow up. RTED for any other concerns.    [CS]  1333 Christian Blaze. was evaluated in Emergency Department on 02/26/2020 for the symptoms described in the history of present illness. He was evaluated in the context of the global COVID-19 pandemic, which necessitated consideration that the patient might be at risk for infection with the SARS-CoV-2 virus that causes COVID-19. Institutional protocols and algorithms that pertain to the evaluation of patients at risk for COVID-19 are in a state of rapid change based on information released by regulatory bodies including the CDC and federal and state organizations. These policies and algorithms were followed during the patient's care in the ED.     [CS]    Clinical Course User Index [CS] Pollyann Savoy, MD    Final Clinical Impression(s) / ED Diagnoses Final diagnoses:  COVID-19    Rx / DC Orders ED Discharge Orders    None       Pollyann Savoy, MD 02/26/20 1334

## 2020-02-26 NOTE — Discharge Instructions (Addendum)
The Monoclonal Antibody team will contact you about getting an infusion. You should get a home oxygen monitor from the pharmacy. You should return to the ER if your oxygen level drops below 90% and does not come back up.

## 2020-02-26 NOTE — Telephone Encounter (Signed)
Called to discuss with [name]. about Covid symptoms and potential candidacy for the use of sotrovimab, a combination monoclonal antibody infusion for those with mild to moderate Covid symptoms and at a high risk of hospitalization.     Pt is qualified for this infusion at the infusion center due to co-morbid conditions and/or a member of an at-risk group, however unable to reach patient. VM  left.   Christian Francois,NP

## 2020-02-26 NOTE — ED Triage Notes (Signed)
Pt reports cough, nasal and chest congestion, generalized body aches x 2 days. Pt denies fever.

## 2020-02-27 ENCOUNTER — Other Ambulatory Visit: Payer: Self-pay | Admitting: Infectious Diseases

## 2020-02-27 DIAGNOSIS — E119 Type 2 diabetes mellitus without complications: Secondary | ICD-10-CM

## 2020-02-27 DIAGNOSIS — E669 Obesity, unspecified: Secondary | ICD-10-CM

## 2020-02-27 DIAGNOSIS — U071 COVID-19: Secondary | ICD-10-CM

## 2020-02-27 DIAGNOSIS — I214 Non-ST elevation (NSTEMI) myocardial infarction: Secondary | ICD-10-CM

## 2020-02-27 NOTE — Progress Notes (Signed)
I connected by phone with [name]. on 02/27/2020 at 9:16 AM to discuss the potential use of a new treatment for mild to moderate COVID-19 viral infection in non-hospitalized patients.  This patient is a 74 y.o. male that meets the FDA criteria for Emergency Use Authorization of COVID monoclonal antibody casirivimab/imdevimab, bamlanivimab/eteseviamb, or sotrovimab.  Has a (+) direct SARS-CoV-2 viral test result  Has mild or moderate COVID-19   Is NOT hospitalized due to COVID-19  Is within 10 days of symptom onset  Has at least one of the high risk factor(s) for progression to severe COVID-19 and/or hospitalization as defined in EUA.  Specific high risk criteria : Older age (>/= 74 yo), BMI > 25, Diabetes and Cardiovascular disease or hypertension   I have spoken and communicated the following to the patient or parent/caregiver regarding COVID monoclonal antibody treatment:  1. FDA has authorized the emergency use for the treatment of mild to moderate COVID-19 in adults and pediatric patients with positive results of direct SARS-CoV-2 viral testing who are 44 years of age and older weighing at least 40 kg, and who are at high risk for progressing to severe COVID-19 and/or hospitalization.  2. The significant known and potential risks and benefits of COVID monoclonal antibody, and the extent to which such potential risks and benefits are unknown.  3. Information on available alternative treatments and the risks and benefits of those alternatives, including clinical trials.  4. Patients treated with COVID monoclonal antibody should continue to self-isolate and use infection control measures (e.g., wear mask, isolate, social distance, avoid sharing personal items, clean and disinfect "high touch" surfaces, and frequent handwashing) according to CDC guidelines.   5. The patient or parent/caregiver has the option to accept or refuse COVID monoclonal antibody treatment.  After  reviewing this information with the patient, the patient has agreed to receive one of the available covid 19 monoclonal antibodies and will be provided an appropriate fact sheet prior to infusion. Christian Alberts, NP 02/27/2020 9:16 AM

## 2020-02-27 NOTE — Telephone Encounter (Signed)
Second attempt to reach [name]   He has been vaccinated against COVID (Completed in February) but has not had booster yet.  He had a heart attack earlier this year and feels like he may be developing pneumonia. CXR yesterday was reassuring and non-hypoxic. He is symptom day 3 now.   His wife is feeling very bad also with classic symptoms. Has not had a test yet but would like to get her treated also  They both will be scheduled tomorrow AM.    [name], MSN, NP-C [facility]  [email or web address] Pager: [phone] Office: [phone] [facility] Main Line: [phone]

## 2020-02-28 ENCOUNTER — Ambulatory Visit (HOSPITAL_COMMUNITY)
Admission: RE | Admit: 2020-02-28 | Discharge: 2020-02-28 | Disposition: A | Discharge status: Home / Self Care | Payer: Medicare Other | Source: Ambulatory Visit | Attending: Pulmonary Disease | Admitting: Pulmonary Disease

## 2020-02-28 DIAGNOSIS — I214 Non-ST elevation (NSTEMI) myocardial infarction: Secondary | ICD-10-CM | POA: Insufficient documentation

## 2020-02-28 DIAGNOSIS — U071 COVID-19: Secondary | ICD-10-CM | POA: Insufficient documentation

## 2020-02-28 DIAGNOSIS — E119 Type 2 diabetes mellitus without complications: Secondary | ICD-10-CM | POA: Diagnosis not present

## 2020-02-28 DIAGNOSIS — E669 Obesity, unspecified: Secondary | ICD-10-CM | POA: Diagnosis not present

## 2020-02-28 MED ORDER — DIPHENHYDRAMINE HCL 50 MG/ML IJ SOLN: 50.0000 mg | Freq: Once | INTRAMUSCULAR | Status: DC | PRN

## 2020-02-28 MED ORDER — METHYLPREDNISOLONE SODIUM SUCC 125 MG IJ SOLR: 125.0000 mg | Freq: Once | INTRAMUSCULAR | Status: DC | PRN

## 2020-02-28 MED ORDER — ALBUTEROL SULFATE HFA 108 (90 BASE) MCG/ACT IN AERS: 2.0000 | INHALATION_SPRAY | Freq: Once | RESPIRATORY_TRACT | Status: DC | PRN

## 2020-02-28 MED ORDER — EPINEPHRINE 0.3 MG/0.3ML IJ SOAJ: 0.3000 mg | Freq: Once | INTRAMUSCULAR | Status: DC | PRN

## 2020-02-28 MED ORDER — SOTROVIMAB 500 MG/8ML IV SOLN
500.0000 mg | Freq: Once | INTRAVENOUS | Status: AC
  Administered 2020-02-28: 500 mg via INTRAVENOUS

## 2020-02-28 MED ORDER — FAMOTIDINE IN NACL 20-0.9 MG/50ML-% IV SOLN: 20.0000 mg | Freq: Once | INTRAVENOUS | Status: DC | PRN

## 2020-02-28 MED ORDER — SODIUM CHLORIDE 0.9 % IV SOLN: INTRAVENOUS | Status: DC | PRN

## 2020-02-28 NOTE — Discharge Instructions (Signed)

## 2020-02-28 NOTE — Progress Notes (Signed)
  Diagnosis: COVID-19  Physician: Dr. Wright  Procedure: Covid Infusion Clinic Med: sotrovimab infusion - Provided patient with sotrovimab fact sheet for patients, parents and caregivers prior to infusion.  Complications: No immediate complications noted.  Discharge: Discharged home   [name] 02/28/2020   

## 2020-04-11 ENCOUNTER — Telehealth: Payer: Self-pay | Admitting: Cardiology

## 2020-04-11 NOTE — Telephone Encounter (Signed)
The earliest this patient can stop his Plavix should be after May 14, 2020.

## 2020-04-11 NOTE — Telephone Encounter (Signed)
   [facility] Pre-operative Risk Assessment    HEARTCARE STAFF: - Please ensure there is not already an duplicate clearance open for this procedure. - Under Visit Info/Reason for Call, type in Other and utilize the format Clearance MM/DD/YY or Clearance TBD. Do not use dashes or single digits. - If request is for dental extraction, please clarify the # of teeth to be extracted.  Request for surgical clearance:  1. What type of surgery is being performed? Epidural Steroid Injection   2. When is this surgery scheduled? TBD   3. What type of clearance is required (medical clearance vs. Pharmacy clearance to hold med vs. Both)? Pharmacy   4. Are there any medications that need to be held prior to surgery and how long? Plavix for 7 days prior  5. Practice name and name of physician performing surgery? [facility], Dr. Everette Rank   6. What is the office phone number? [phone]   7.   What is the office fax number? [phone]  8.   Anesthesia type (None, local, MAC, general) ? None    [name] 04/11/2020, 8:48 AM  _________________________________________________________________   (provider comments below)

## 2020-04-11 NOTE — Telephone Encounter (Signed)
[name] 74 year old male requests epidural steroid injection.  He was last seen by you in the clinic on 11/12/2019. During that time he was doing well. He denied chest pain, shortness of breath, nausea, and vomiting. He had previously been seen by Joni Reining, NP-C post hospitalization 6/21. 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  LHC 09/27/2019   Mid Cx lesion is 60% stenosed.  Prox RCA lesion is 50% stenosed.  LV end diastolic pressure is mildly elevated.  Prox LAD to Mid LAD lesion is 20% stenosed.  Prox LAD lesion is 95% stenosed.  Post intervention, there is a 0% residual stenosis.  A drug-eluting stent was successfully placed using a STENT RESOLUTE ONYX 3.5X15.   1. Single vessel obstructive CAD with 95% stenosis in the proximal LAD. This appears to be proximal to old stent. 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  2. Mildly elevated LVEDP 19 mm Hg 3. Successful PCI of the proximal LAD with DES x 1 using IVUS guidance.  Plan: DAPT with ASA for one month and Plavix for 12 months. Can resume oral anticoagulation tomorrow if no bleeding. Anticipate DC tomorrow.   Diagnostic Dominance: Right    Intervention       His PMH also includes DVT on Pradaxa, Parkinson's disease, HTN, and DM. Echocardiogram post MI showed ischemic cardiomyopathy with an EF of 45-50%.  May his Plavix be held prior to the procedure or would you like him to postpone the injection?  Thank you for your help. Please direct response to CV DIV preop for.  [name]. [name] NP-C    04/11/2020, 9:07 AM [facility] [address] Office [phone] Fax [phone]

## 2020-04-11 NOTE — Telephone Encounter (Signed)
   Primary Cardiologist: [name], DO  Chart reviewed as part of pre-operative protocol coverage. Given past medical history and time since last visit, based on ACC/AHA guidelines, [name]. would be at acceptable risk for the planned procedure without further cardiovascular testing.   Due to the patient's recent cardiac stenting, he will not be able to hold Plavix until after May 14, 2020.  After May 14, 2020 is Plavix may be held for 5-7 days prior to the procedure. Please resume as soon as hemostasis is achieved.  I will route this recommendation to the requesting party via Epic fax function and remove from pre-op pool.  Please call with questions.  [name]. Syair Fricker NP-C    04/11/2020, 10:52 AM [facility] [address] Office [phone] Fax [phone]

## 2020-04-12 NOTE — Telephone Encounter (Signed)
Called and discussed Plavix and stenting this is why he can not have surgery until after January 23,2022. Pt verbalized understanding

## 2020-04-12 NOTE — Telephone Encounter (Signed)
Patient called in and requested to speak with someone in Dr. Mallory Shirk office regarding this clearance. Patient states he does not understand why he cannot have his epidural done until after January 23rd. He states he knows he was cleared to hold Plavix but does not understand why he has to wait until the 23rd to hold it. Please call/advise. Patients best contact number is [phone]. Patient will be on the road and unable to take any calls tomorrow and is requesting someone call him today to go over this.   Thank you!

## 2020-04-12 NOTE — Telephone Encounter (Signed)
Please contact [name] and let him know that his Plavix must be continued until May 14, 2020 to prevent his recent cardiac stents from becoming blocked.  There is also increased risk of bleeding with epidural spinal injections.  Due to this increased risk he will have to wait until after May 14, 2020 to receive his back injections.  Thank you.

## 2020-04-12 NOTE — Telephone Encounter (Signed)
Forwarded to requesting party via Epic fax function 

## 2020-04-19 IMAGING — DX RIGHT HAND - COMPLETE 3+ VIEW
3 series · 3 of 3 positions shown · non-contrast
Comparison: None.

CLINICAL DATA: Pain in the third metacarpal.

EXAM:
RIGHT HAND - COMPLETE 3+ VIEW

[hand pa]
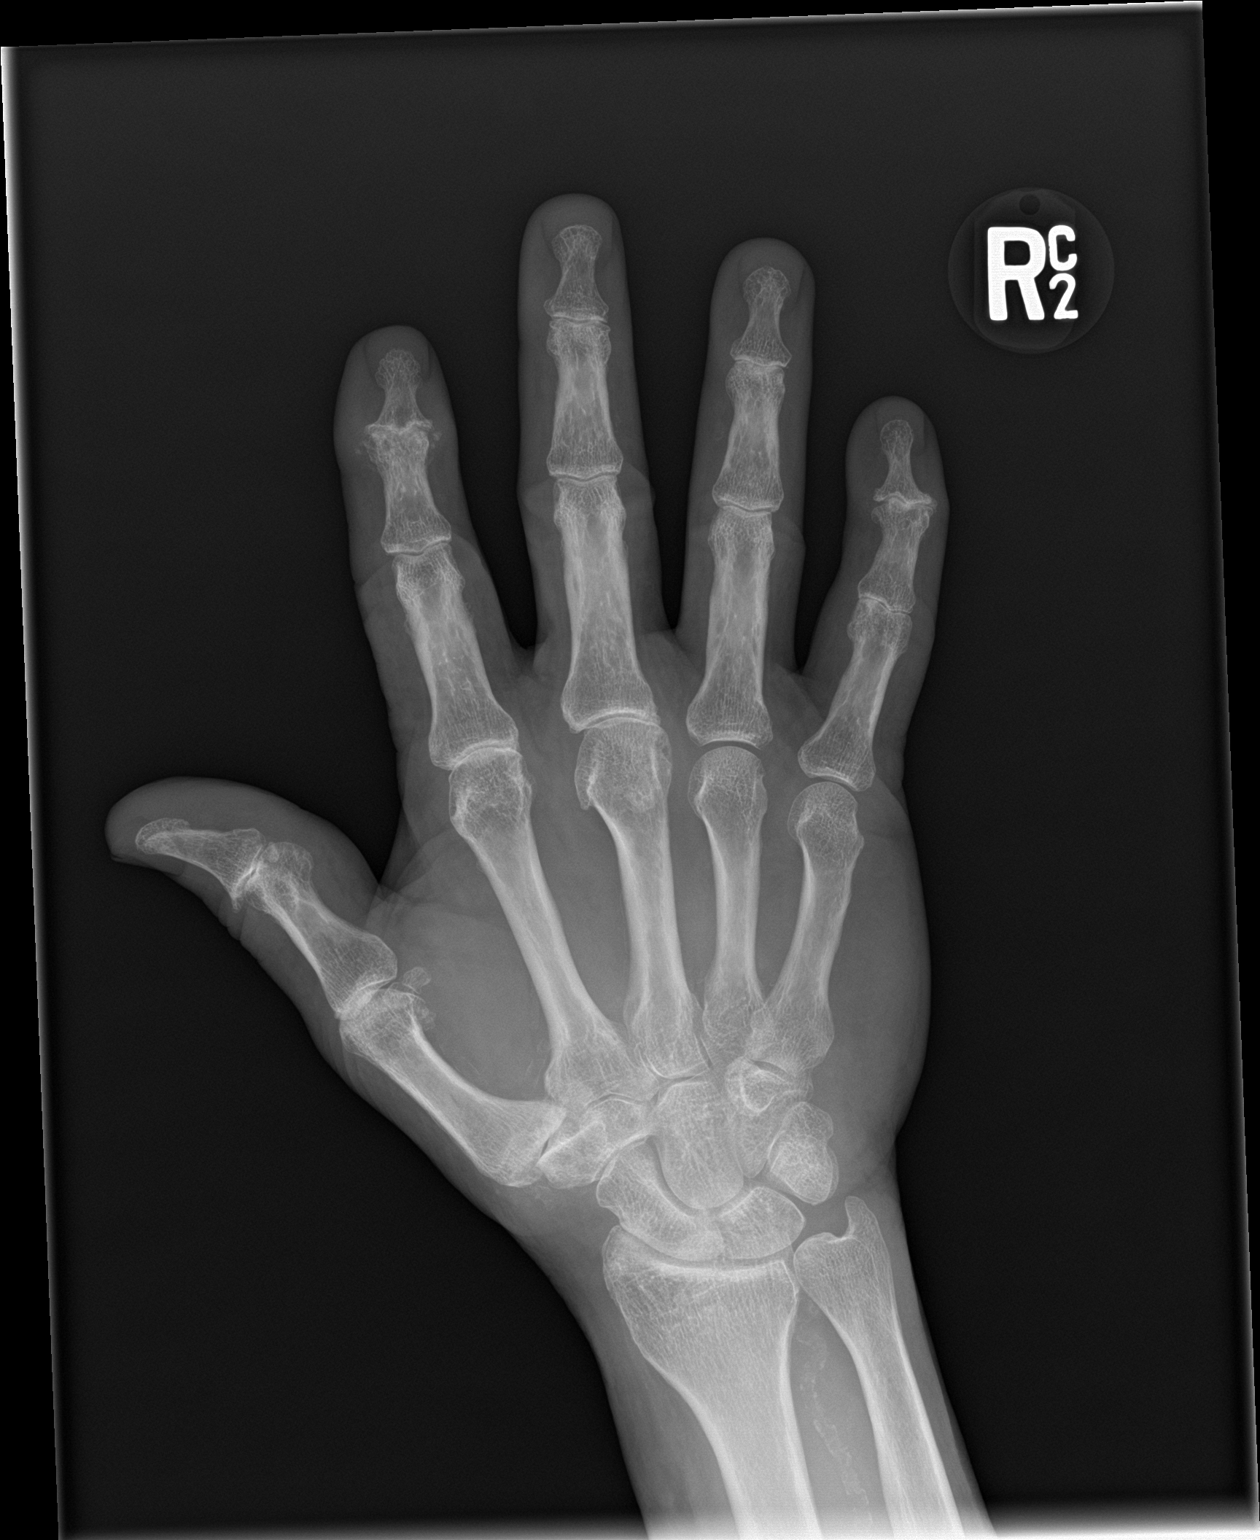

[hand obl]
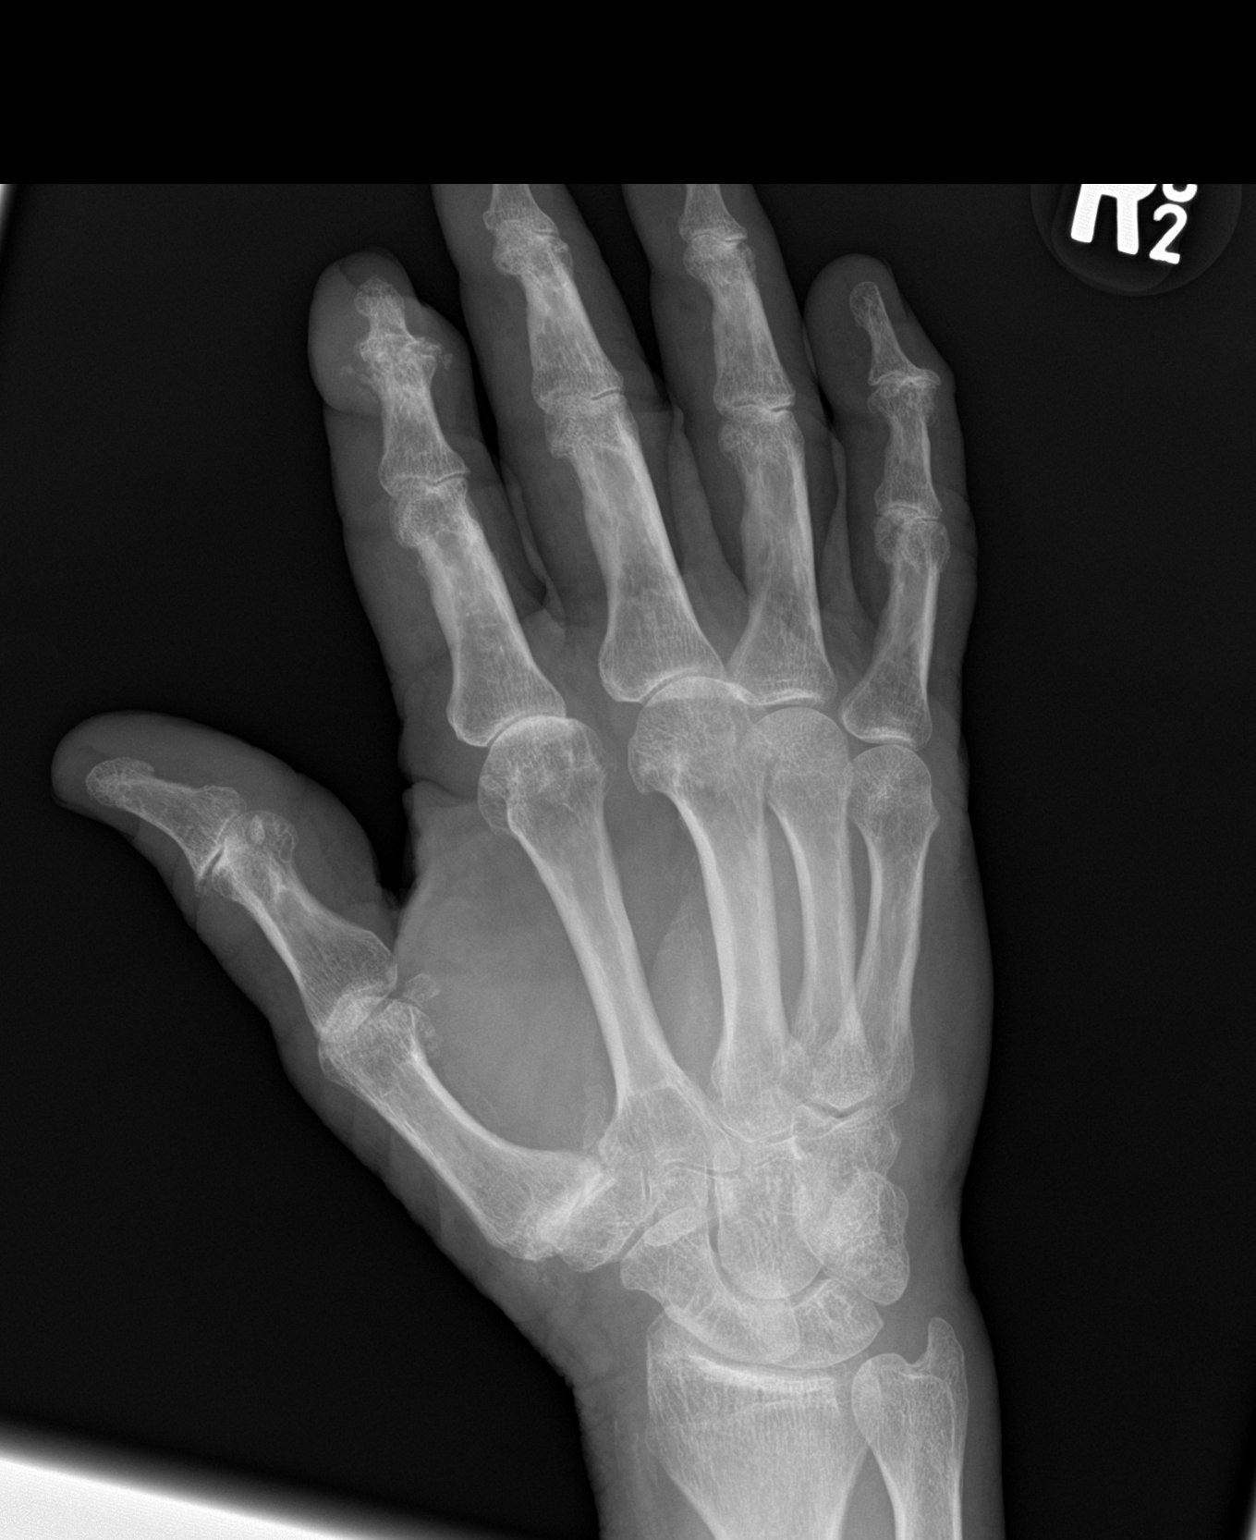

[hand lat]
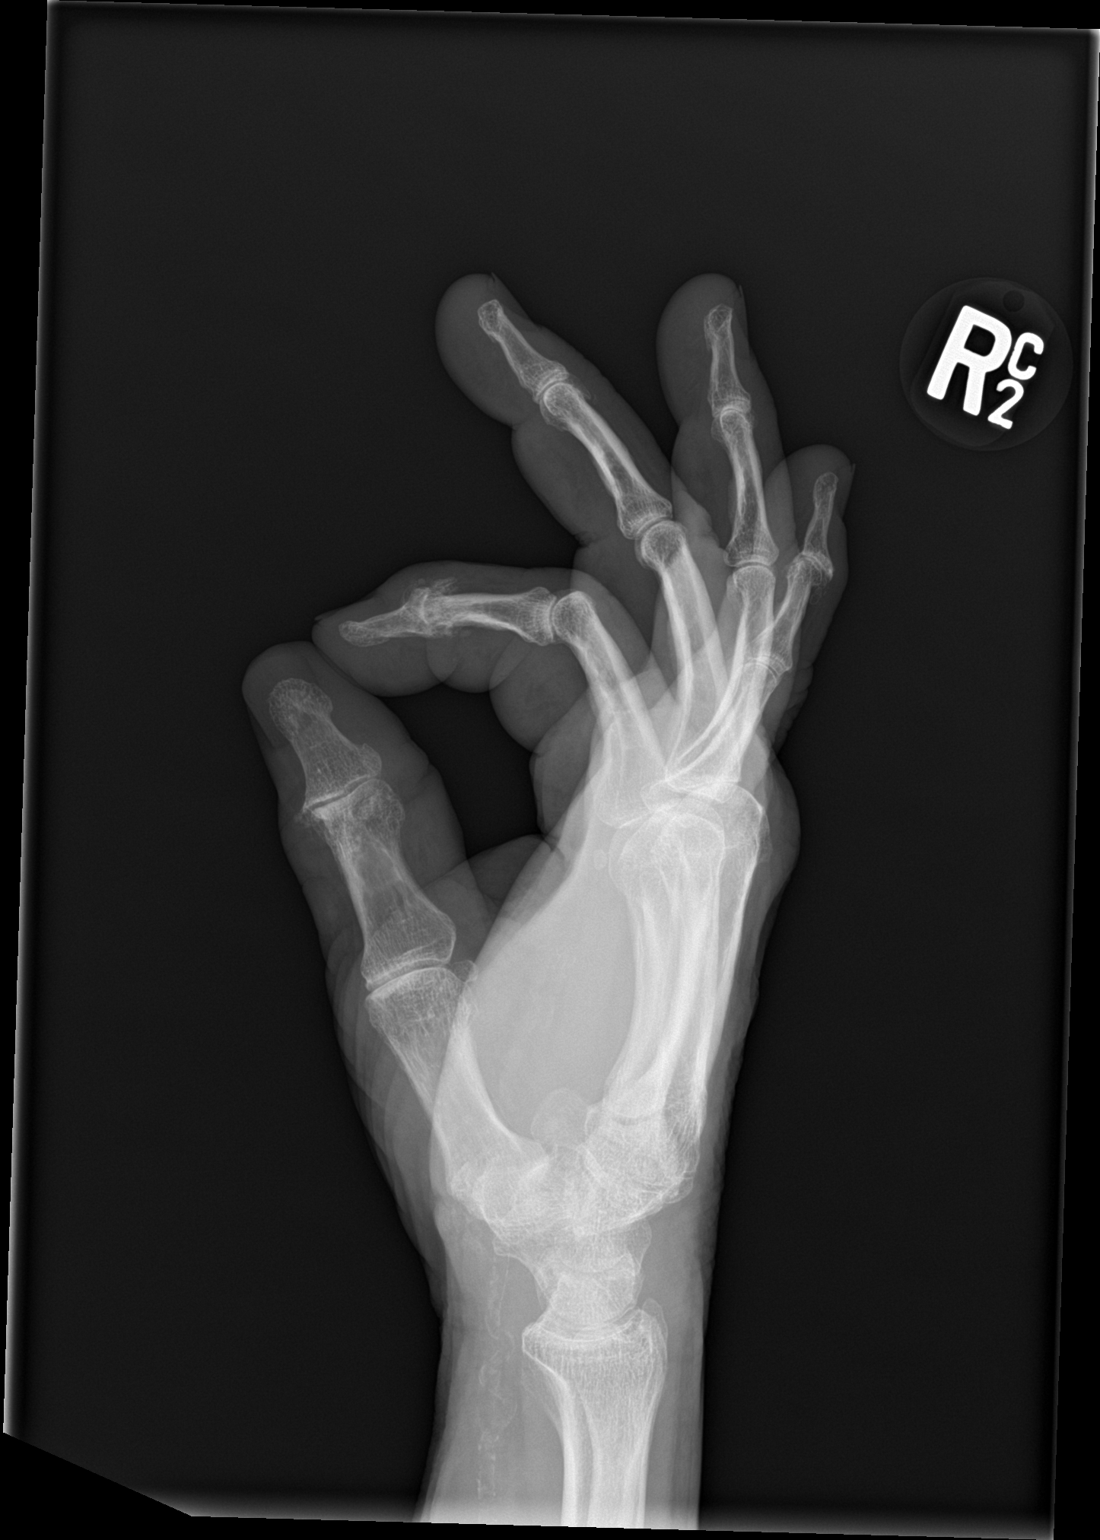

[3 of 3 positions shown; findings below may reference images not displayed]

FINDINGS: There is no acute displaced fracture or dislocation. There are
degenerative changes of the interphalangeal joints. There are
degenerative changes of the third metacarpophalangeal joint.
Vascular calcifications are noted.
IMPRESSION: No acute displaced fracture or dislocation. Degenerative changes as
above.

## 2020-04-23 ENCOUNTER — Emergency Department (HOSPITAL_BASED_OUTPATIENT_CLINIC_OR_DEPARTMENT_OTHER)
Admission: EM | Admit: 2020-04-23 | Discharge: 2020-04-23 | Disposition: A | Discharge status: Home / Self Care | Payer: Medicare Other | Attending: Emergency Medicine | Admitting: Emergency Medicine

## 2020-04-23 ENCOUNTER — Other Ambulatory Visit: Payer: Self-pay

## 2020-04-23 DIAGNOSIS — R110 Nausea: Secondary | ICD-10-CM | POA: Diagnosis not present

## 2020-04-23 DIAGNOSIS — R109 Unspecified abdominal pain: Secondary | ICD-10-CM | POA: Insufficient documentation

## 2020-04-23 DIAGNOSIS — Z5321 Procedure and treatment not carried out due to patient leaving prior to being seen by health care provider: Secondary | ICD-10-CM | POA: Insufficient documentation

## 2020-04-23 NOTE — ED Triage Notes (Signed)
Pt started having left flank pain and radiates down to testicle an hour ago. Complains of nausea. Took hydrocodone an hour ago without relief.

## 2020-05-13 ENCOUNTER — Emergency Department (HOSPITAL_BASED_OUTPATIENT_CLINIC_OR_DEPARTMENT_OTHER)
Admission: EM | Admit: 2020-05-13 | Discharge: 2020-05-13 | Disposition: A | Discharge status: Home / Self Care | Payer: Medicare Other | Attending: Emergency Medicine | Admitting: Emergency Medicine

## 2020-05-13 ENCOUNTER — Other Ambulatory Visit: Payer: Self-pay

## 2020-05-13 ENCOUNTER — Encounter (HOSPITAL_BASED_OUTPATIENT_CLINIC_OR_DEPARTMENT_OTHER): Payer: Self-pay | Admitting: Emergency Medicine

## 2020-05-13 ENCOUNTER — Emergency Department (HOSPITAL_BASED_OUTPATIENT_CLINIC_OR_DEPARTMENT_OTHER): Payer: Medicare Other

## 2020-05-13 DIAGNOSIS — E119 Type 2 diabetes mellitus without complications: Secondary | ICD-10-CM | POA: Diagnosis not present

## 2020-05-13 DIAGNOSIS — I10 Essential (primary) hypertension: Secondary | ICD-10-CM | POA: Diagnosis not present

## 2020-05-13 DIAGNOSIS — Z955 Presence of coronary angioplasty implant and graft: Secondary | ICD-10-CM | POA: Diagnosis not present

## 2020-05-13 DIAGNOSIS — Z7902 Long term (current) use of antithrombotics/antiplatelets: Secondary | ICD-10-CM | POA: Insufficient documentation

## 2020-05-13 DIAGNOSIS — Z8673 Personal history of transient ischemic attack (TIA), and cerebral infarction without residual deficits: Secondary | ICD-10-CM | POA: Insufficient documentation

## 2020-05-13 DIAGNOSIS — I2510 Atherosclerotic heart disease of native coronary artery without angina pectoris: Secondary | ICD-10-CM | POA: Diagnosis not present

## 2020-05-13 DIAGNOSIS — M25471 Effusion, right ankle: Secondary | ICD-10-CM

## 2020-05-13 DIAGNOSIS — G20 Parkinson's disease: Secondary | ICD-10-CM | POA: Insufficient documentation

## 2020-05-13 DIAGNOSIS — M25571 Pain in right ankle and joints of right foot: Secondary | ICD-10-CM

## 2020-05-13 DIAGNOSIS — Z86718 Personal history of other venous thrombosis and embolism: Secondary | ICD-10-CM | POA: Diagnosis not present

## 2020-05-13 DIAGNOSIS — Z79899 Other long term (current) drug therapy: Secondary | ICD-10-CM | POA: Diagnosis not present

## 2020-05-13 MED ORDER — CEPHALEXIN 500 MG PO CAPS: 500.0000 mg | ORAL_CAPSULE | Freq: Four times a day (QID) | ORAL | 0 refills | Status: AC

## 2020-05-13 NOTE — ED Provider Notes (Signed)
[facility] EMERGENCY DEPARTMENT Provider Note   CSN: [id number] Arrival date & time: 05/13/20  2440     History Chief Complaint  Patient presents with  . Foot Pain    [name]. is a 75 y.o. male.  HPI   Patient is a 75 year old male with a complicated medical history as noted below.  He reports atraumatic right ankle pain that started last night.  Patient reports exquisite pain and mild swelling along the dorsum of the right ankle that wraps around the medial malleolus of the right ankle.  Pain worsens with ambulation as well as any range of motion of the ankle.  Has not taken anything for his symptoms.  He walks without a cane at baseline but has been using his wife's cane due to his pain.  No numbness, fevers, chills, chest pain, shortness of breath.      Past Medical History:  Diagnosis Date  . Acquired pes planus, left 06/02/2018  . ACS (acute coronary syndrome) (HCC) 09/25/2019  . Ankle weakness 10/23/2015  . Antiphospholipid antibody syndrome (HCC) 09/28/2016  . Arthritis of ankle, left 10/23/2015  . Atopic dermatitis 09/09/2016  . Benign essential tremor 07/04/2015  . Bradycardia 07/11/2015  . Cataract cortical, senile, bilateral 02/27/2016  . Chest pain 07/11/2015  . Chronic pain of right ankle 06/07/2016  . Coronary artery disease   . Coronary artery disease involving native coronary artery 07/11/2015  . Current use of long term anticoagulation 12/24/2013  . DDD (degenerative disc disease), cervical 07/16/2013   Formatting of this note might be different from the original. STORY: MRI C spine revealed multilevel DDD without disc protrution or impingement. Severe right facet arthritis C 2-3, moderate severe right facet arthritis C7-T1.  Discussed with patient in legnth, he's not satisfied with the result of pain control. Will refer him to see Dr. Loletta Parish to consider facet block and will increase HCE to 10t  . Diabetes mellitus   . Diplopia 12/17/2016  . DVT (deep  venous thrombosis) (HCC)   . DVT of leg (deep venous thrombosis) (HCC) 12/24/2013   Formatting of this note might be different from the original. Three times in left lower leg  . Erectile dysfunction 09/09/2016  . Essential hypertension 12/24/2013  . GERD (gastroesophageal reflux disease) 12/24/2013   Formatting of this note might be different from the original. Barrett's Esophagus with dysplasia, prior ablations at [facility] of this note might be different from the original. Overview:  Barrett's Esophagus with dysplasia, prior ablations at [facility]  . History of Barrett's esophagus 07/11/2015  . History of DVT (deep vein thrombosis) 07/11/2015  . History of MI (myocardial infarction) 04/21/2017  . Hypercoagulable state (HCC) 04/21/2017   Formatting of this note might be different from the original. History of anti-Cardiolite and antibody positive with DVT in 2002 following casting and 2008 following an MI by multiple mini strokes that occurred despite aspirin therapy for which she was switched to Coumadin with difficulty maintaining his INR and then complicated by his third DVT unprovoked  . Hyperlipidemia, unspecified 12/24/2013  . Hypertension   . Impaired functional mobility, balance, gait, and endurance 11/10/2019  . Insomnia 07/04/2015  . Lumbar disc herniation with radiculopathy 10/17/2017  . Major depressive disorder, recurrent, in remission (HCC) 07/04/2015  . MI (myocardial infarction) (HCC) 12/24/2013   Formatting of this note might be different from the original. Nov 2002, 2 stents Guidant multi-link ZETA 1027253-66, [id number]  . Mild cognitive impairment 07/11/2015  . Myocardial  infarction (HCC)   . Neck pain 07/16/2013   Formatting of this note might be different from the original. STORY: DDx:DDD, cervical spondylosis, facet arthropathy, radiculopathy.  . Nontraumatic rupture of left posterior tibial tendon 01/18/2016  . NSTEMI (non-ST elevated myocardial infarction) (HCC) 09/25/2019  . Nuclear  sclerotic cataract of both eyes 02/27/2016  . Obesity (BMI 30-39.9) 07/04/2015  . Other specified anxiety disorders 04/21/2017  . Pain of upper abdomen 07/11/2015  . Parkinson disease (HCC) 04/03/2016  . Parkinson's disease (HCC)   . Personal history of transient ischemic attack (TIA), and cerebral infarction without residual deficits 12/24/2013   Formatting of this note might be different from the original. No residuals, Dec 2009, Feb 2010 and last 2012  . Right arm pain 06/03/2019  . Sacroiliitis (HCC) 02/25/2018  . Stroke (HCC)   . Tenosynovitis of right ankle 06/07/2016  . Tibialis posterior tendinitis, left 06/02/2018  . Type 2 diabetes mellitus without retinopathy (HCC) 12/24/2013   Formatting of this note might be different from the original. Diet controlled, A1C always around 5.9    Patient Active Problem List   Diagnosis Date Noted  . Ischemic cardiomyopathy 11/13/2019  . Depressed left ventricular ejection fraction 11/13/2019  . Impaired functional mobility, balance, gait, and endurance 11/10/2019  . ACS (acute coronary syndrome) (HCC) 09/25/2019  . NSTEMI (non-ST elevated myocardial infarction) (HCC) 09/25/2019  . Right arm pain 06/03/2019  . Acquired pes planus, left 06/02/2018  . Tibialis posterior tendinitis, left 06/02/2018  . Sacroiliitis (HCC) 02/25/2018  . Lumbar disc herniation with radiculopathy 10/17/2017  . History of MI (myocardial infarction) 04/21/2017  . Hypercoagulable state (HCC) 04/21/2017  . Other specified anxiety disorders 04/21/2017  . Diplopia 12/17/2016  . Antiphospholipid antibody syndrome (HCC) 09/28/2016  . Atopic dermatitis 09/09/2016  . Erectile dysfunction 09/09/2016  . Chronic pain of right ankle 06/07/2016  . Tenosynovitis of right ankle 06/07/2016  . Parkinson disease (HCC) 04/03/2016  . Cataract cortical, senile, bilateral 02/27/2016  . Nuclear sclerotic cataract of both eyes 02/27/2016  . Nontraumatic rupture of left posterior tibial tendon  01/18/2016  . Ankle weakness 10/23/2015  . Arthritis of ankle, left 10/23/2015  . Coronary artery disease involving native coronary artery 07/11/2015  . Bradycardia 07/11/2015  . Chest pain 07/11/2015  . History of Barrett's esophagus 07/11/2015  . History of DVT (deep vein thrombosis) 07/11/2015  . Mild cognitive impairment 07/11/2015  . Pain of upper abdomen 07/11/2015  . Benign essential tremor 07/04/2015  . Insomnia 07/04/2015  . Major depressive disorder, recurrent, in remission (HCC) 07/04/2015  . Obesity (BMI 30-39.9) 07/04/2015  . Essential hypertension 12/24/2013  . Hyperlipidemia, unspecified 12/24/2013  . Type 2 diabetes mellitus without retinopathy (HCC) 12/24/2013  . DVT of leg (deep venous thrombosis) (HCC) 12/24/2013  . Current use of long term anticoagulation 12/24/2013  . GERD (gastroesophageal reflux disease) 12/24/2013  . Personal history of transient ischemic attack (TIA), and cerebral infarction without residual deficits 12/24/2013  . MI (myocardial infarction) (HCC) 12/24/2013  . DDD (degenerative disc disease), cervical 07/16/2013  . Neck pain 07/16/2013    Past Surgical History:  Procedure Laterality Date  . CORONARY ANGIOPLASTY WITH STENT PLACEMENT    . CORONARY STENT INTERVENTION N/A 09/27/2019   Procedure: CORONARY STENT INTERVENTION;  Surgeon: Swaziland, Peter M, MD;  Location[facility] INVASIVE CV LAB;  Service: Cardiovascular;  Laterality: N/A;  . INTRAVASCULAR ULTRASOUND/IVUS N/A 09/27/2019   Procedure: Intravascular Ultrasound/IVUS;  Surgeon: Swaziland, Peter M, MD;  Location: [facility] INVASIVE CV LAB;  Service: Cardiovascular;  Laterality: N/A;  . LEFT HEART CATH AND CORONARY ANGIOGRAPHY N/A 09/27/2019   Procedure: LEFT HEART CATH AND CORONARY ANGIOGRAPHY;  Surgeon: Swaziland, Peter M, MD;  Location: [facility] INVASIVE CV LAB;  Service: Cardiovascular;  Laterality: N/A;       Family History  Problem Relation Age of Onset  . CAD Father 64       Died of MI  . CAD Brother 64        MI    Social History   Tobacco Use  . Smoking status: Never Smoker  . Smokeless tobacco: Never Used  Vaping Use  . Vaping Use: Never used  Substance Use Topics  . Alcohol use: Yes    Alcohol/week: 2.0 standard drinks    Types: 2 Standard drinks or equivalent per week    Comment: daily  . Drug use: No    Home Medications Prior to Admission medications   Medication Sig Start Date End Date Taking? Authorizing Provider  cephALEXin (KEFLEX) 500 MG capsule Take 1 capsule (500 mg total) by mouth 4 (four) times daily for 10 days. 05/13/20 05/23/20 Yes Placido Sou, PA-C  acetaminophen (TYLENOL) 500 MG tablet Take 1,000 mg by mouth in the morning and at bedtime.    [provider]  ALPRAZolam Prudy Feeler) 1 MG tablet Take 0.5 mg by mouth 3 (three) times daily as needed for anxiety.     [provider]  amLODipine (NORVASC) 5 MG tablet Take 1 tablet (5 mg total) by mouth daily. 10/06/19   Jodelle Gross, NP  atorvastatin (LIPITOR) 80 MG tablet Take 1 tablet (80 mg total) by mouth daily. 09/28/19   Furth, Cadence H, PA-C  buPROPion (WELLBUTRIN XL) 300 MG 24 hr tablet Take 300 mg by mouth daily.    [provider]  carbidopa-levodopa (SINEMET IR) 25-100 MG tablet Take 1.5 tablets by mouth 3 (three) times daily. As directed 11/05/19   [provider]  clopidogrel (PLAVIX) 75 MG tablet Take 1 tablet (75 mg total) by mouth daily with breakfast. 02/15/20   Tobb, Kardie, DO  co-enzyme Q-10 30 MG capsule Take 30 mg by mouth daily.    [provider]  dabigatran (PRADAXA) 150 MG CAPS capsule Take 150 mg by mouth 2 (two) times daily.    [provider]  gabapentin (NEURONTIN) 400 MG capsule Take by mouth in the morning.     [provider]  gabapentin (NEURONTIN) 600 MG tablet Take 600 mg by mouth at bedtime.    [provider]  hydrochlorothiazide (HYDRODIURIL) 25 MG tablet Take 1 tablet (25 mg total) by mouth daily. 02/15/20    Tobb, Kardie, DO  HYDROcodone-acetaminophen (NORCO/VICODIN) 5-325 MG tablet Take 1 tablet by mouth every 6 (six) hours as needed for pain. 09/01/19   [provider]  irbesartan (AVAPRO) 300 MG tablet Take 1 tablet (300 mg total) by mouth daily. 09/28/19   Furth, Cadence H, PA-C  nitroGLYCERIN (NITROSTAT) 0.4 MG SL tablet Place 1 tablet (0.4 mg total) under the tongue every 5 (five) minutes as needed for chest pain. 10/06/19 01/04/20  Jodelle Gross, NP  oxycodone (OXY-IR) 5 MG capsule Take 5 mg by mouth every 4 (four) hours as needed for pain.     [provider]  pantoprazole (PROTONIX) 40 MG tablet Take 40 mg by mouth 2 (two) times daily.     [provider]  zolpidem (AMBIEN) 10 MG tablet Take 5 mg by mouth at bedtime as needed  for sleep.     [provider]    Allergies    Lisinopril  Review of Systems   Review of Systems  Constitutional: Negative for chills and fever.  Respiratory: Negative for shortness of breath.   Cardiovascular: Negative for chest pain.  Musculoskeletal: Positive for arthralgias, joint swelling and myalgias.  Skin: Negative for color change and wound.  Neurological: Negative for numbness.   Physical Exam Updated Vital Signs BP (!) 142/91 (BP Location: Right Arm)   Pulse 87   Temp 97.9 F (36.6 C) (Oral)   Resp 18   Ht 5\' 8"  (1.727 m)   Wt 95.3 kg   SpO2 99%   BMI 31.93 kg/m   Physical Exam Vitals and nursing note reviewed.  Constitutional:      General: He is not in acute distress.    Appearance: He is well-developed.  HENT:     Head: Normocephalic and atraumatic.     Right Ear: External ear normal.     Left Ear: External ear normal.  Eyes:     General: No scleral icterus.       Right eye: No discharge.        Left eye: No discharge.     Conjunctiva/sclera: Conjunctivae normal.  Neck:     Trachea: No tracheal deviation.  Cardiovascular:     Rate and Rhythm: Normal rate.  Pulmonary:     Effort:  Pulmonary effort is normal. No respiratory distress.     Breath sounds: No stridor.  Abdominal:     General: There is no distension.  Musculoskeletal:        General: Swelling and tenderness present. No deformity.     Cervical back: Neck supple.     Comments: Moderate tenderness noted diffusely along the right ankle ranging from the dorsum of the right ankle as well as the medial malleolus.  Additional moderate swelling overlying and surrounding the medial malleolus.  Difficult to assess range of motion secondary to pain.  Distal sensation intact.  Patient able to move all the toes of the right foot without difficulty.  Ambulatory with a cane.  2+ DP pulses noted bilaterally.  Good cap refill.  Mild increase in warmth overlying the medial malleolus with trace erythema.    Skin:    General: Skin is warm and dry.     Findings: No erythema or rash.  Neurological:     Mental Status: He is alert.     Cranial Nerves: Cranial nerve deficit: no gross deficits.    ED Results / Procedures / Treatments   Labs (all labs ordered are listed, but only abnormal results are displayed) Labs Reviewed - No data to display  EKG None  Radiology DG Ankle Complete Right  Result Date: 05/13/2020 CLINICAL DATA:  Atraumatic ankle swelling and pain. EXAM: RIGHT ANKLE - COMPLETE 3+ VIEW COMPARISON:  None. FINDINGS: There is no evidence of fracture, dislocation, or joint effusion. Plantar calcaneal spur is identified. Degenerative joint changes of the distal fibula and tibia are noted. Soft tissues are unremarkable. IMPRESSION: No acute fracture or dislocation. Degenerative joint changes of right ankle. Electronically Signed   By: Sherian Rein M.D.   On: 05/13/2020 10:37   Procedures Procedures (including critical care time)  Medications Ordered in ED Medications - No data to display  ED Course  I have reviewed the triage vital signs and the nursing notes.  Pertinent labs & imaging results that were  available during my care of the patient  were reviewed by me and considered in my medical decision making (see chart for details).    MDM Rules/Calculators/A&P                          Pt is a 75 y.o. male who presents to the emergency department with right atraumatic ankle pain and swelling.    Imaging: X-ray of the right ankle shows no acute fracture or dislocation.  There are degenerative joint changes of the right ankle.  I, Placido SouLogan Kellyn Mccary, PA-C, personally reviewed and evaluated these images as part of my medical decision-making.  Erythema noted along the right medial malleolus.  Good range of motion of the joint.  Doubt septic joint.  Patient is on hydrochlorothiazide.  Could possibly be secondary to gout.  No history of gout, per patient.  He does drink about 2 alcoholic beverages per night.  He is neurovascularly intact in the right foot.  Good pedal pulses.  Patient's pain also appears to wrap around the ankle along the Achilles proximally.  Patient is anticoagulated, and has been compliant.  Doubt DVT at this time.  Will discharge on a course of Keflex for possible overlying cellulitis.  Will give referral to orthopedics.  Discussed return precautions.  His questions were answered and he was amicable at the time of discharge.  Patient discussed with and evaluated by my attending physician Dr. Alvira MondayErin Schlossman who was in agreement with the above plan.  Note: Portions of this report may have been transcribed using voice recognition software. Every effort was made to ensure accuracy; however, inadvertent computerized transcription errors may be present.   Final Clinical Impression(s) / ED Diagnoses Final diagnoses:  Acute right ankle pain  Right ankle swelling    Rx / DC Orders ED Discharge Orders         Ordered    cephALEXin (KEFLEX) 500 MG capsule  4 times daily        05/13/20 1154           Placido SouJoldersma, Raidon Swanner, PA-C 05/13/20 1155    Alvira MondaySchlossman, Erin, MD 05/15/20  1148

## 2020-05-13 NOTE — ED Triage Notes (Signed)
Pt arrives pov with c/o right foot and ankle pain that started last night. Pt endorses difficulty walking and standing. Pt denies injury. Swelling and tenderness noted

## 2020-05-13 NOTE — Discharge Instructions (Addendum)
Like we discussed, I am prescribing you an antibiotic called keflex. Please take this four times per day for the next 10 days. Do not stop taking this early.  I have given you follow-up information below for Dr. Shon Baton as well.  He is a local orthopedic doctor.  Please give his office a call and schedule a follow-up appointment.  If you develop worsening symptoms, please return to the emergency department for reevaluation.  It was a pleasure to meet you.

## 2020-06-29 ENCOUNTER — Telehealth: Payer: Self-pay

## 2020-06-29 NOTE — Telephone Encounter (Signed)
MyChart message sent to patient I regards to signing a record release to send his records to another doctor.

## 2020-09-26 ENCOUNTER — Other Ambulatory Visit: Payer: Self-pay | Admitting: Cardiology

## 2021-01-03 ENCOUNTER — Other Ambulatory Visit: Payer: Self-pay | Admitting: Cardiology

## 2021-03-19 ENCOUNTER — Other Ambulatory Visit: Payer: Self-pay | Admitting: Cardiology

## 2021-03-24 IMAGING — DX DG CHEST 2V
2 series · 2 of 2 positions shown · non-contrast
Comparison: January 20, 2017

CLINICAL DATA: Chest pain x2 hours.

EXAM:
CHEST - 2 VIEW

[chest pa]
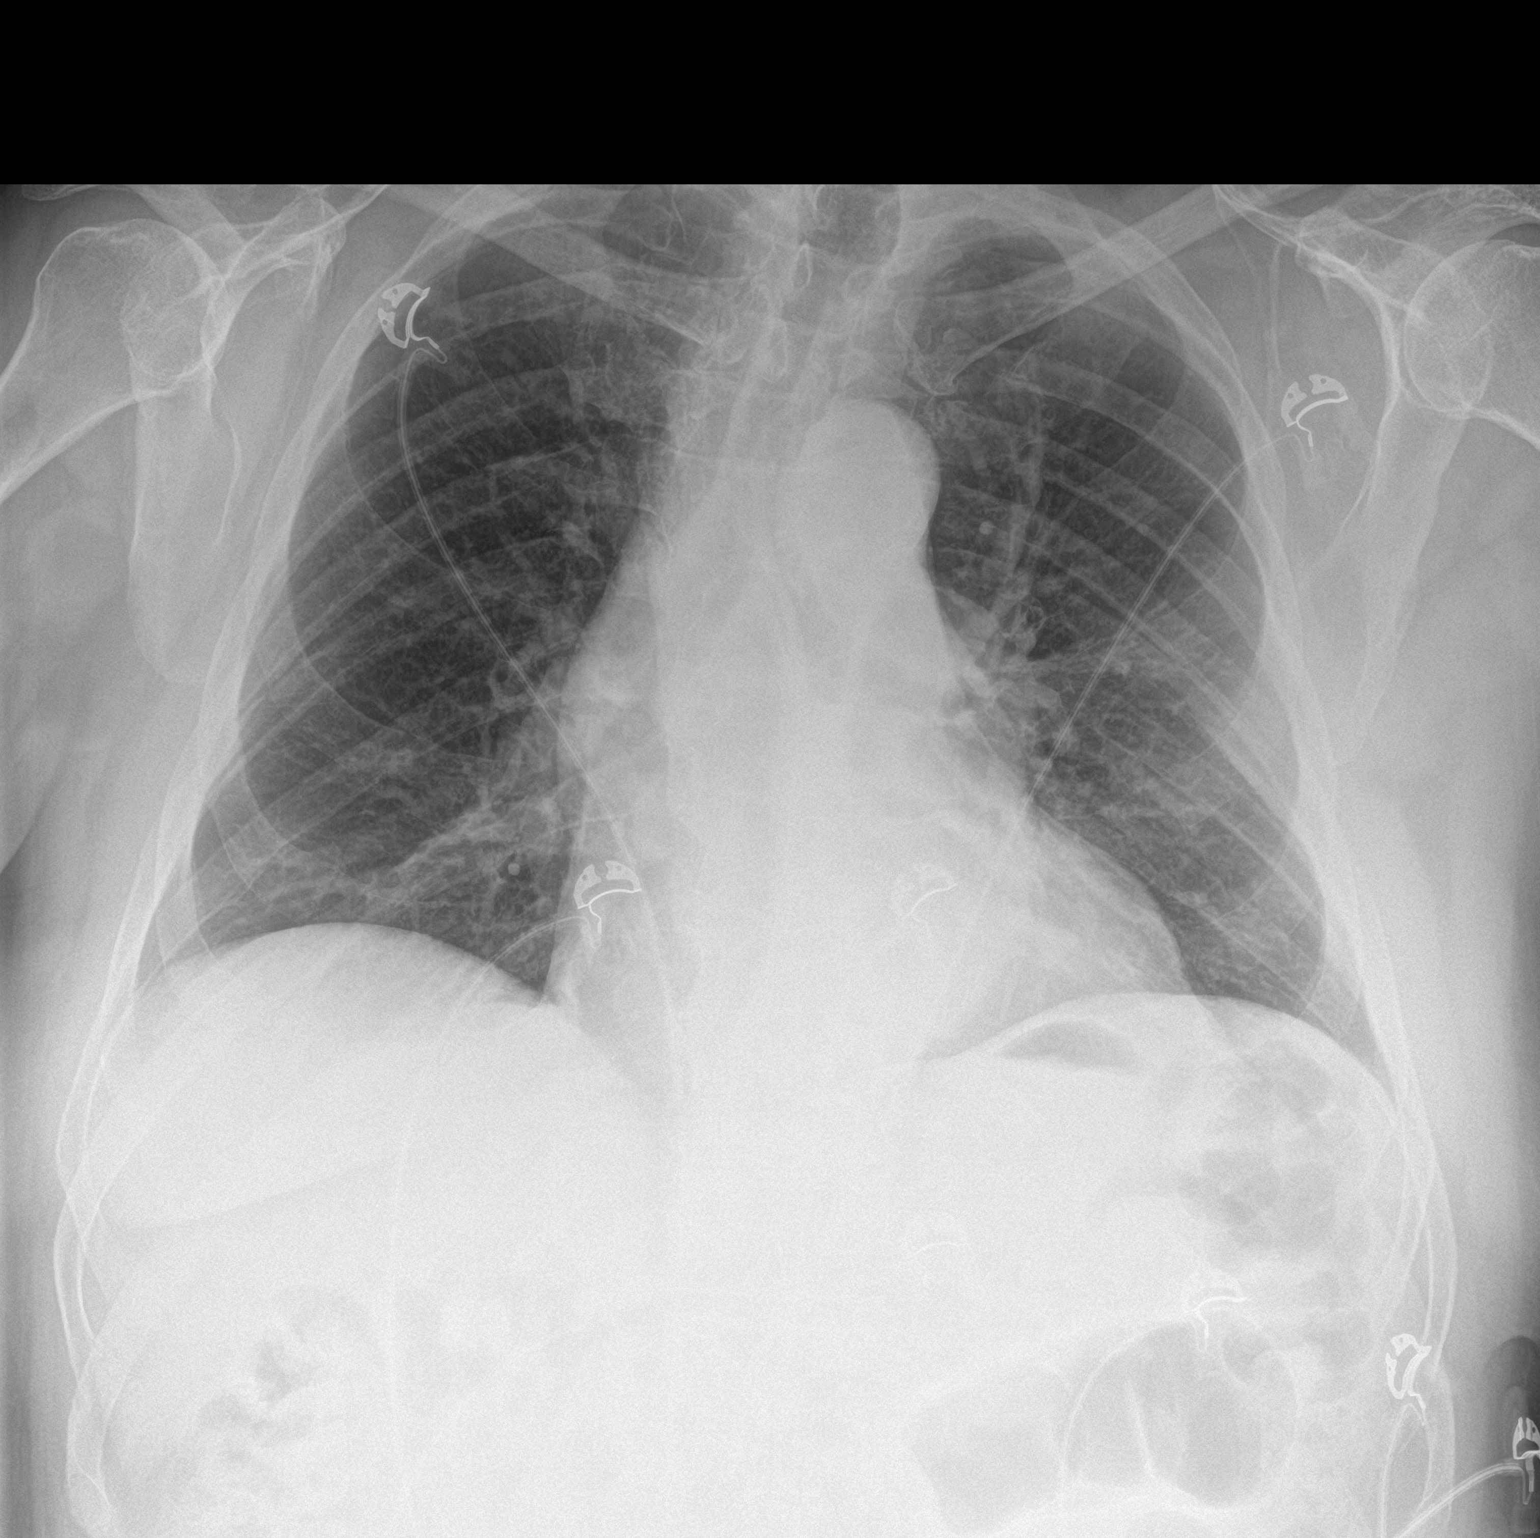

[chest lat]
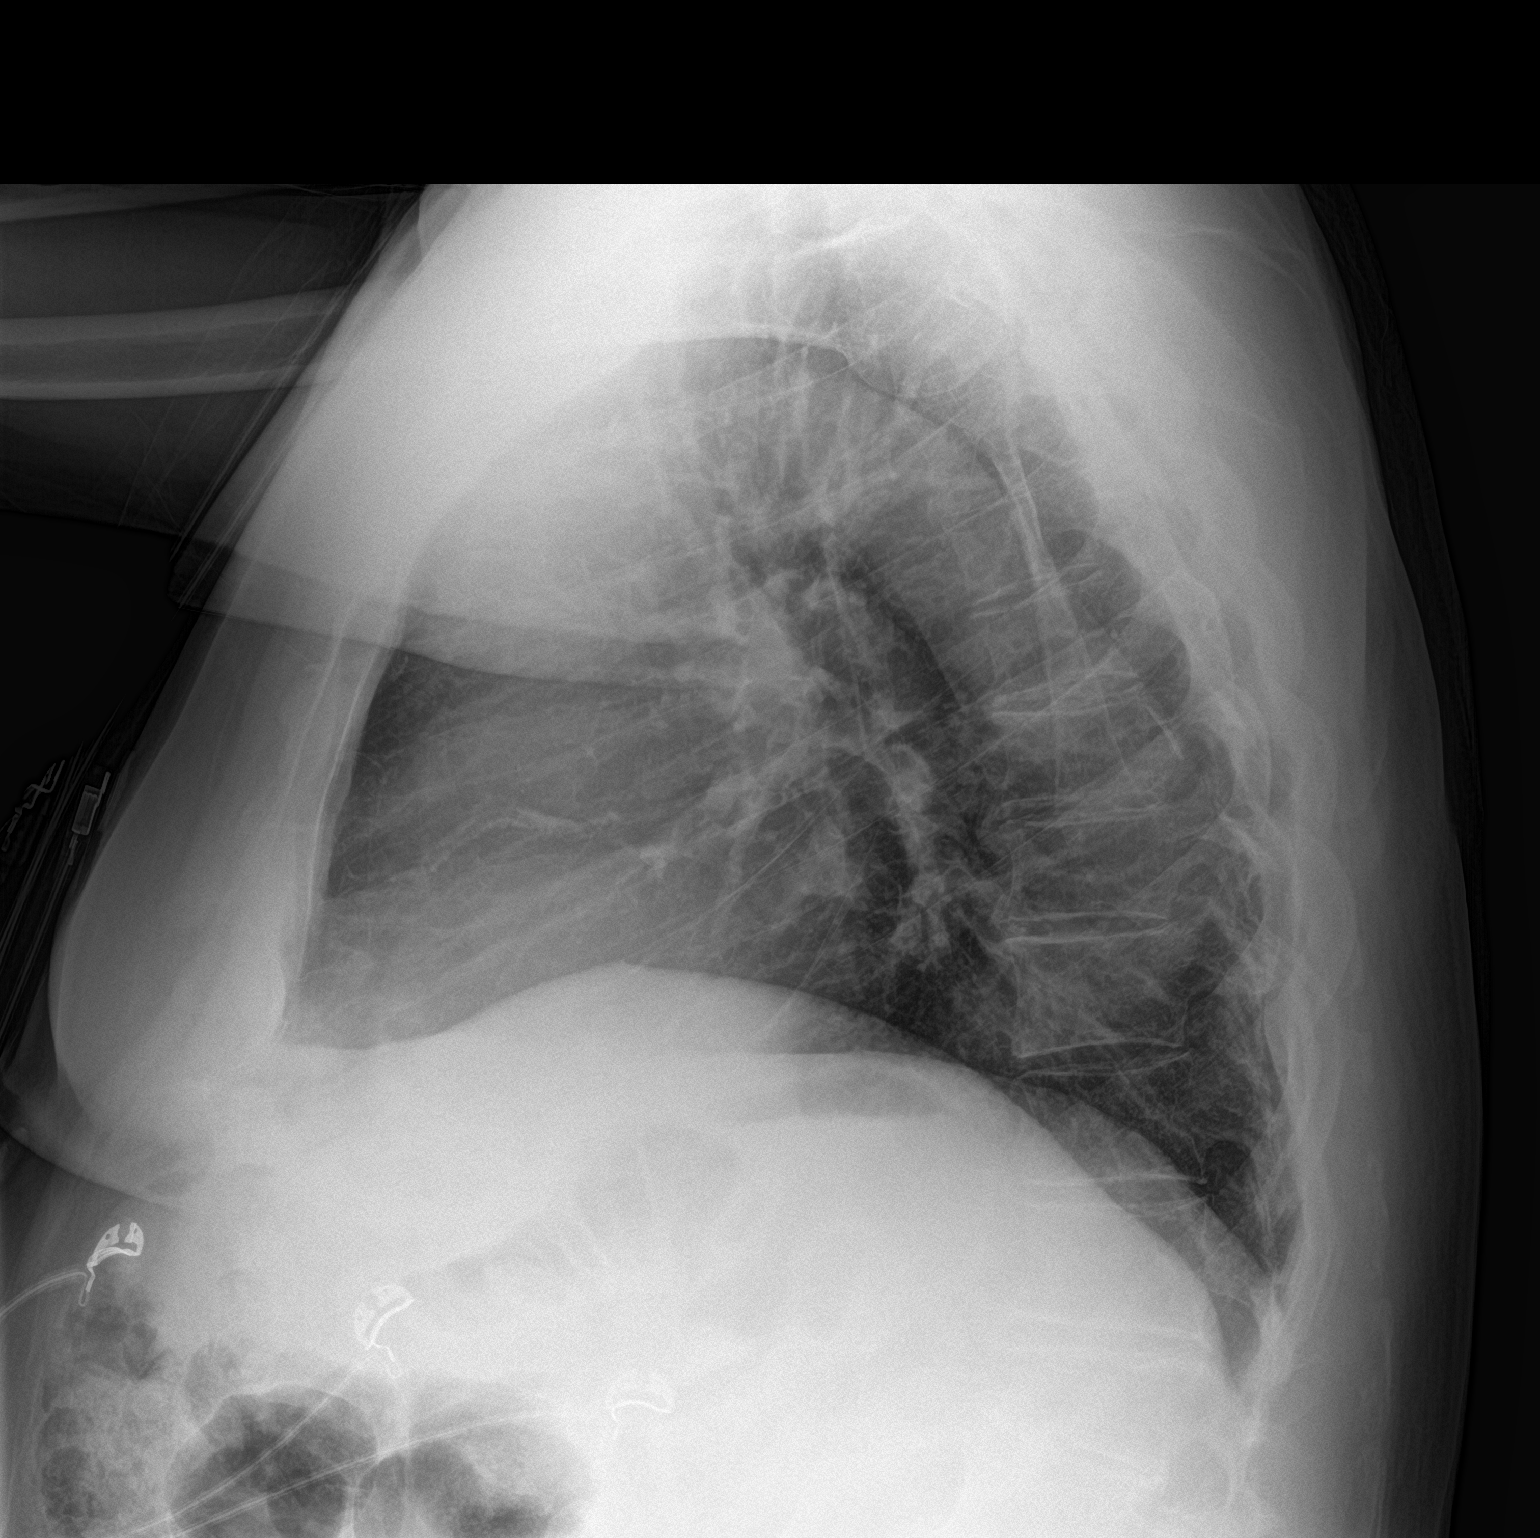

[2 of 2 positions shown; findings below may reference images not displayed]

FINDINGS: Mild atelectasis is seen within the right lung base and mid left
lung. There is no evidence of a pleural effusion or pneumothorax.
The heart size and mediastinal contours are within normal limits.
There is tortuosity of the descending thoracic aorta. Degenerative
changes seen throughout the thoracic spine.
IMPRESSION: Mild right basilar and mid left lung atelectasis.

## 2021-08-25 IMAGING — DX DG CHEST 1V PORT
1 series · 1 of 1 positions shown · non-contrast
Comparison: Chest x-ray 09/25/2019.

CLINICAL DATA: 74-year-old male with history of cough, shortness of
breath and chest pain.

EXAM:
PORTABLE CHEST 1 VIEW

[chest ap]
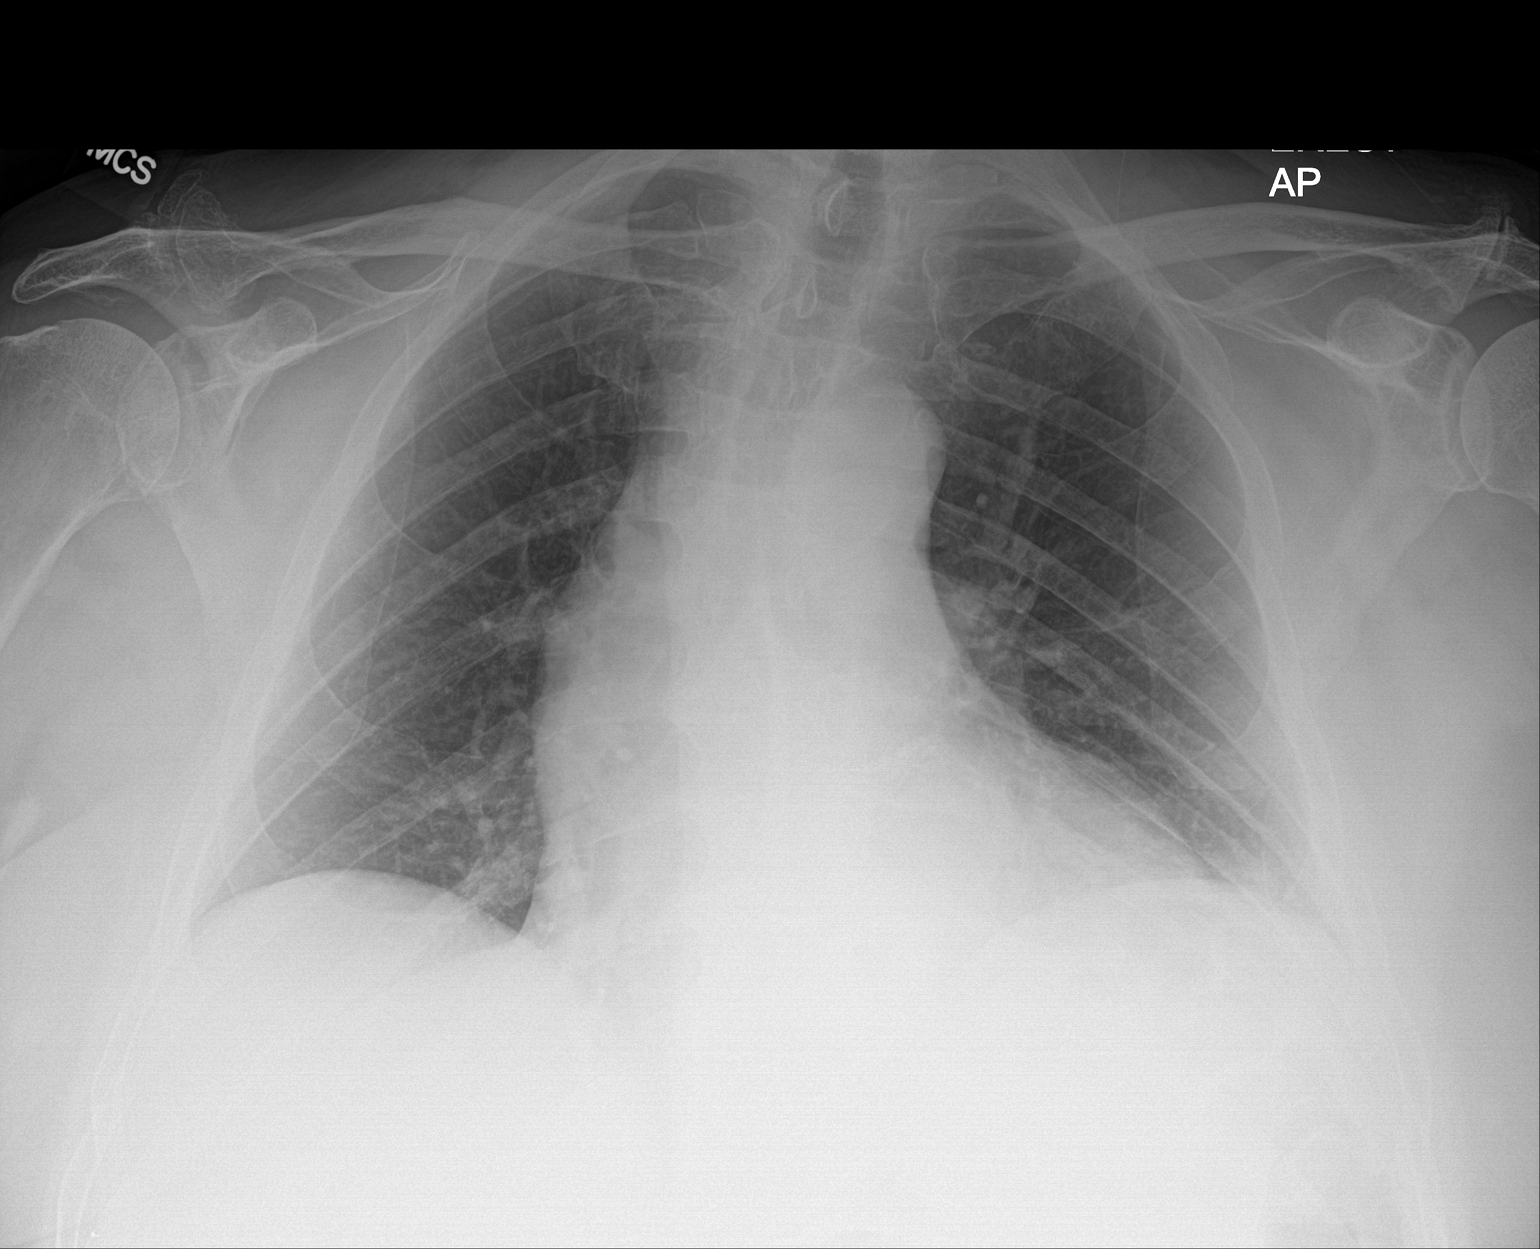

[1 of 1 positions shown; findings below may reference images not displayed]

FINDINGS: Lung volumes are low. No consolidative airspace disease. No pleural
effusions. No pneumothorax. No pulmonary nodule or mass noted.
Pulmonary vasculature and the cardiomediastinal silhouette are
within normal limits allowing for patient rotation to the left.
IMPRESSION: 1. Low lung volumes without radiographic evidence of acute
cardiopulmonary disease.

## 2021-11-02 DIAGNOSIS — I639 Cerebral infarction, unspecified: Secondary | ICD-10-CM

## 2021-11-02 HISTORY — DX: Cerebral infarction, unspecified: I63.9

## 2021-11-15 NOTE — Patient Instructions (Addendum)
DUE TO COVID-19 ONLY TWO VISITORS  (aged 76 and older)  ARE ALLOWED TO COME WITH YOU AND STAY IN THE WAITING ROOM ONLY DURING PRE OP AND PROCEDURE.   **NO VISITORS ARE ALLOWED IN THE SHORT STAY AREA OR RECOVERY ROOM!!**  IF YOU WILL BE ADMITTED INTO THE HOSPITAL YOU ARE ALLOWED ONLY FOUR SUPPORT PEOPLE DURING VISITATION HOURS ONLY (7 AM -8PM)   The support person(s) must pass our screening, gel in and out, and wear a mask at all times, including in the patient's room. Patients must also wear a mask when staff or their support person are in the room. Visitors GUEST BADGE MUST BE WORN VISIBLY  One adult visitor may remain with you overnight and MUST be in the room by 8 P.M.     Your procedure is scheduled on: 11/30/21   Report to [facility] Main Entrance    Report to admitting at 9:15 AM   Call this number if you have problems the morning of surgery [phone]   Do not eat food :After Midnight.   After Midnight you may have the following liquids until _8:30_____ AM/  DAY OF SURGERY  Water Black Coffee (sugar ok, NO MILK/CREAM OR CREAMERS)  Tea (sugar ok, NO MILK/CREAM OR CREAMERS) regular and decaf                             Plain Jell-O (NO RED)                                           Fruit ices (not with fruit pulp, NO RED)                                     Popsicles (NO RED)                                                                  Juice: apple, WHITE grape, WHITE cranberry Sports drinks like Gatorade (NO RED)                   The day of surgery:  Drink ONE G2 at  8:15 AM the morning of surgery. Drink in one sitting. Do not sip.  This drink was given to you during your hospital  pre-op appointment visit. Nothing else to drink after completing the  G2. At 8:30 AM          If you have questions, please contact your surgeon's office.       Oral Hygiene is also important to reduce your risk of infection.                                    Remember -  BRUSH YOUR TEETH THE MORNING OF SURGERY WITH YOUR REGULAR TOOTHPASTE   Do NOT smoke after Midnight   Take these medicines the morning of surgery with A SIP OF WATER: Dabigatran, Gabapentin, Bupropion, Lipitor, Amlodipine, Pantoprazole   Before  surgery.Stop taking _Plavix__and ASA________on _8/6_________as instructed by ___Rogers__________.  Stop taking ____________as directed by your Surgeon/Cardiologist.  Contact your Surgeon/Cardiologist for instructions on Anticoagulant Therapy prior to surgery.    DO NOT TAKE ANY ORAL DIABETIC MEDICATIONS DAY OF YOUR SURGERY  Bring CPAP mask and tubing day of surgery.                              You may not have any metal on your body including jewelry, and body piercing             Do not wear  lotions, powders, perfumes/cologne, or deodorant               Men may shave face and neck.   Do not bring valuables to the hospital. [facility] IS NOT             RESPONSIBLE   FOR VALUABLES.   Contacts, dentures or bridgework may not be worn into surgery.   Bring small overnight bag day of surgery.   DO NOT BRING YOUR HOME MEDICATIONS TO THE HOSPITAL. PHARMACY WILL DISPENSE MEDICATIONS LISTED ON YOUR MEDICATION LIST TO YOU DURING YOUR ADMISSION IN THE HOSPITAL!       Special Instructions: Bring a copy of your healthcare power of attorney and living will documents   the day of surgery if you haven't scanned them before.              Please read over the following fact sheets you were given: IF YOU HAVE QUESTIONS ABOUT YOUR PRE-OP INSTRUCTIONS PLEASE CALL [phone]   [facility]- Preparing for Total Shoulder Arthroplasty    Before surgery, you can play an important role. Because skin is not sterile, your skin needs to be as free of germs as possible. You can reduce the number of germs on your skin by using the following products. Benzoyl Peroxide Gel Reduces the number of germs present on the skin Applied twice a day to shoulder area  starting two days before surgery    ==================================================================  Please follow these instructions carefully:  BENZOYL PEROXIDE 5% GEL  Please do not use if you have an allergy to benzoyl peroxide.   If your skin becomes reddened/irritated stop using the benzoyl peroxide.  Starting two days before surgery, apply as follows: Apply benzoyl peroxide in the morning and at night. Apply after taking a shower. If you are not taking a shower clean entire shoulder front, back, and side along with the armpit with a clean wet washcloth.  Place a quarter-sized dollop on your shoulder and rub in thoroughly, making sure to cover the front, back, and side of your shoulder, along with the armpit.   2 days before ____ AM   ____ PM              1 day before ____ AM   ____ PM                         Do this twice a day for two days.  (Last application is the night before surgery, AFTER using the CHG soap as described below).  Do NOT apply benzoyl peroxide gel on the day of surgery.       [facility] - Preparing for Surgery Before surgery, you can play an important role.  Because skin is not sterile, your skin needs to be as free of germs as  possible.  You can reduce the number of germs on your skin by washing with CHG (chlorahexidine gluconate) soap before surgery.  CHG is an antiseptic cleaner which kills germs and bonds with the skin to continue killing germs even after washing. Please DO NOT use if you have an allergy to CHG or antibacterial soaps.  If your skin becomes reddened/irritated stop using the CHG and inform your nurse when you arrive at Short Stay. You may shave your face/neck. Please follow these instructions carefully:  1.  Shower with CHG Soap the night before surgery and the  morning of Surgery.  2.  If you choose to wash your hair, wash your hair first as usual with your  normal  shampoo.  3.  After you shampoo, rinse your hair and body thoroughly to  remove the  shampoo.                            4.  Use CHG as you would any other liquid soap.  You can apply chg directly  to the skin and wash                       Gently with a scrungie or clean washcloth.  5.  Apply the CHG Soap to your body ONLY FROM THE NECK DOWN.   Do not use on face/ open                           Wound or open sores. Avoid contact with eyes, ears mouth and genitals (private parts).                       Wash face,  Genitals (private parts) with your normal soap.             6.  Wash thoroughly, paying special attention to the area where your surgery  will be performed.  7.  Thoroughly rinse your body with warm water from the neck down.  8.  DO NOT shower/wash with your normal soap after using and rinsing off  the CHG Soap.                9.  Pat yourself dry with a clean towel.            10.  Wear clean pajamas.            11.  Place clean sheets on your bed the night of your first shower and do not  sleep with pets. Day of Surgery : Do not apply any lotions/deodorants the morning of surgery.  Please wear clean clothes to the hospital/surgery center.  FAILURE TO FOLLOW THESE INSTRUCTIONS MAY RESULT IN THE CANCELLATION OF YOUR SURGERY    ________________________________________________________________________   Incentive Spirometer  An incentive spirometer is a tool that can help keep your lungs clear and active. This tool measures how well you are filling your lungs with each breath. Taking long deep breaths may help reverse or decrease the chance of developing breathing (pulmonary) problems (especially infection) following: A long period of time when you are unable to move or be active. BEFORE THE PROCEDURE  If the spirometer includes an indicator to show your best effort, your nurse or respiratory therapist will set it to a desired goal. If possible, sit up straight or lean slightly forward. Try not to slouch. Hold the incentive  spirometer in an upright  position. INSTRUCTIONS FOR USE  Sit on the edge of your bed if possible, or sit up as far as you can in bed or on a chair. Hold the incentive spirometer in an upright position. Breathe out normally. Place the mouthpiece in your mouth and seal your lips tightly around it. Breathe in slowly and as deeply as possible, raising the piston or the ball toward the top of the column. Hold your breath for 3-5 seconds or for as long as possible. Allow the piston or ball to fall to the bottom of the column. Remove the mouthpiece from your mouth and breathe out normally. Rest for a few seconds and repeat Steps 1 through 7 at least 10 times every 1-2 hours when you are awake. Take your time and take a few normal breaths between deep breaths. The spirometer may include an indicator to show your best effort. Use the indicator as a goal to work toward during each repetition. After each set of 10 deep breaths, practice coughing to be sure your lungs are clear. If you have an incision (the cut made at the time of surgery), support your incision when coughing by placing a pillow or rolled up towels firmly against it. Once you are able to get out of bed, walk around indoors and cough well. You may stop using the incentive spirometer when instructed by your caregiver.  RISKS AND COMPLICATIONS Take your time so you do not get dizzy or light-headed. If you are in pain, you may need to take or ask for pain medication before doing incentive spirometry. It is harder to take a deep breath if you are having pain. AFTER USE Rest and breathe slowly and easily. It can be helpful to keep track of a log of your progress. Your caregiver can provide you with a simple table to help with this. If you are using the spirometer at home, follow these instructions: SEEK MEDICAL CARE IF:  You are having difficultly using the spirometer. You have trouble using the spirometer as often as instructed. Your pain medication is not giving  enough relief while using the spirometer. You develop fever of 100.5 F (38.1 C) or higher. SEEK IMMEDIATE MEDICAL CARE IF:  You cough up bloody sputum that had not been present before. You develop fever of 102 F (38.9 C) or greater. You develop worsening pain at or near the incision site. MAKE SURE YOU:  Understand these instructions. Will watch your condition. Will get help right away if you are not doing well or get worse. Document Released: 08/19/2006 Document Revised: 07/01/2011 Document Reviewed: 10/20/2006 [facility] Patient Information 2014 [place], [place].   ________________________________________________________________________

## 2021-11-23 ENCOUNTER — Encounter (HOSPITAL_COMMUNITY): Payer: Self-pay

## 2021-11-23 ENCOUNTER — Encounter (HOSPITAL_COMMUNITY)
Admission: RE | Admit: 2021-11-23 | Discharge: 2021-11-23 | Disposition: A | Discharge status: Home / Self Care | Payer: Medicare HMO | Source: Ambulatory Visit | Attending: Orthopedic Surgery | Admitting: Orthopedic Surgery

## 2021-11-23 ENCOUNTER — Other Ambulatory Visit: Payer: Self-pay

## 2021-11-23 DIAGNOSIS — R001 Bradycardia, unspecified: Secondary | ICD-10-CM | POA: Diagnosis not present

## 2021-11-23 DIAGNOSIS — I252 Old myocardial infarction: Secondary | ICD-10-CM | POA: Insufficient documentation

## 2021-11-23 DIAGNOSIS — D6869 Other thrombophilia: Secondary | ICD-10-CM | POA: Insufficient documentation

## 2021-11-23 DIAGNOSIS — Z86718 Personal history of other venous thrombosis and embolism: Secondary | ICD-10-CM | POA: Insufficient documentation

## 2021-11-23 DIAGNOSIS — M5030 Other cervical disc degeneration, unspecified cervical region: Secondary | ICD-10-CM | POA: Diagnosis not present

## 2021-11-23 DIAGNOSIS — Z955 Presence of coronary angioplasty implant and graft: Secondary | ICD-10-CM | POA: Diagnosis not present

## 2021-11-23 DIAGNOSIS — I2510 Atherosclerotic heart disease of native coronary artery without angina pectoris: Secondary | ICD-10-CM | POA: Insufficient documentation

## 2021-11-23 DIAGNOSIS — M75101 Unspecified rotator cuff tear or rupture of right shoulder, not specified as traumatic: Secondary | ICD-10-CM | POA: Diagnosis not present

## 2021-11-23 DIAGNOSIS — G20 Parkinson's disease: Secondary | ICD-10-CM | POA: Insufficient documentation

## 2021-11-23 DIAGNOSIS — I10 Essential (primary) hypertension: Secondary | ICD-10-CM | POA: Insufficient documentation

## 2021-11-23 DIAGNOSIS — E119 Type 2 diabetes mellitus without complications: Secondary | ICD-10-CM | POA: Insufficient documentation

## 2021-11-23 DIAGNOSIS — I440 Atrioventricular block, first degree: Secondary | ICD-10-CM | POA: Diagnosis not present

## 2021-11-23 DIAGNOSIS — Z8673 Personal history of transient ischemic attack (TIA), and cerebral infarction without residual deficits: Secondary | ICD-10-CM | POA: Insufficient documentation

## 2021-11-23 DIAGNOSIS — Z01812 Encounter for preprocedural laboratory examination: Secondary | ICD-10-CM | POA: Insufficient documentation

## 2021-11-23 DIAGNOSIS — Z01818 Encounter for other preprocedural examination: Secondary | ICD-10-CM

## 2021-11-23 HISTORY — DX: Type 2 diabetes mellitus without complications: E11.9

## 2021-11-23 LAB — CBC
HCT: 44.1 % (ref 39.0–52.0)
Hemoglobin: 14.7 g/dL (ref 13.0–17.0)
MCH: 32 pg (ref 26.0–34.0)
MCHC: 33.3 g/dL (ref 30.0–36.0)
MCV: 95.9 fL (ref 80.0–100.0)
Platelets: 226 10*3/uL (ref 150–400)
RBC: 4.6 MIL/uL (ref 4.22–5.81)
RDW: 13.1 % (ref 11.5–15.5)
WBC: 7.3 10*3/uL (ref 4.0–10.5)
nRBC: 0 % (ref 0.0–0.2)

## 2021-11-23 LAB — BASIC METABOLIC PANEL
Anion gap: 6 (ref 5–15)
BUN: 29 mg/dL — ABNORMAL HIGH (ref 8–23)
CO2: 25 mmol/L (ref 22–32)
Calcium: 8.8 mg/dL — ABNORMAL LOW (ref 8.9–10.3)
Chloride: 106 mmol/L (ref 98–111)
Creatinine, Ser: 1.25 mg/dL — ABNORMAL HIGH (ref 0.61–1.24)
GFR, Estimated: 60 mL/min — ABNORMAL LOW (ref >60)
Glucose, Bld: 117 mg/dL — ABNORMAL HIGH (ref 70–99)
Potassium: 4.4 mmol/L (ref 3.5–5.1)
Sodium: 137 mmol/L (ref 135–145)

## 2021-11-23 LAB — SURGICAL PCR SCREEN
MRSA, PCR: NEGATIVE
Staphylococcus aureus: NEGATIVE

## 2021-11-23 LAB — HEMOGLOBIN A1C
Hgb A1c MFr Bld: 6 % — ABNORMAL HIGH (ref 4.8–5.6)
Mean Plasma Glucose: 125.5 mg/dL

## 2021-11-23 LAB — GLUCOSE, CAPILLARY: Glucose-Capillary: 116 mg/dL — ABNORMAL HIGH (ref 70–99)

## 2021-11-23 NOTE — Progress Notes (Signed)
Anesthesia note:  Bowel prep reminder:NA  PCP - Dr. Suzanna Obey Cardiologist -Dr. Beverely Pace Other-   Chest x-ray - no EKG - 11/23/21-epic Stress Test - no ECHO - 09/22/21-epic Cardiac Cath - with stents 2013,2021  Pacemaker/ICD device last checked:NA  Sleep Study - no CPAP -   Fasting Blood Sugar - Doesn't test anymore . He is diet controlled Checks Blood Sugar _____  Blood Thinner:Plavix and ASA/ Dr. Beverely Pace for a TIA in July 2023. Blood Thinner Instructions:Dr. Aundria Rud said to stop 5 days prior to DOS. Pt will call Dr. Beverely Pace for approval. Aspirin Instructions:none Last Dose:11/24/21  Anesthesia review: yes  Patient denies shortness of breath, fever, cough and chest pain at PAT appointment Pt reports SOB with strenuous activities but not climbing stairs or working in the yard.  Patient verbalized understanding of instructions that were given to them at the PAT appointment. Patient was also instructed that they will need to review over the PAT instructions again at home before surgery. yes

## 2021-11-26 NOTE — Anesthesia Preprocedure Evaluation (Addendum)
Anesthesia Evaluation  Patient identified by MRN, date of birth, ID band Patient awake    Reviewed: Allergy & Precautions, H&P , NPO status , Patient's Chart, lab work & pertinent test results  Airway Mallampati: III  TM Distance: >3 FB Neck ROM: Full    Dental no notable dental hx. (+) Teeth Intact, Dental Advisory Given   Pulmonary neg pulmonary ROS,    Pulmonary exam normal breath sounds clear to auscultation       Cardiovascular hypertension, Pt. on medications + CAD, + Past MI and + Cardiac Stents   Rhythm:Regular Rate:Normal     Neuro/Psych Anxiety Depression  Neuromuscular disease CVA    GI/Hepatic Neg liver ROS, GERD  Medicated,  Endo/Other  diabetes, Well Controlled  Renal/GU negative Renal ROS  negative genitourinary   Musculoskeletal  (+) Arthritis , Osteoarthritis,    Abdominal   Peds  Hematology negative hematology ROS (+)   Anesthesia Other Findings   Reproductive/Obstetrics negative OB ROS                          Anesthesia Physical Anesthesia Plan  ASA: 3  Anesthesia Plan: General   Post-op Pain Management: Regional block* and Tylenol PO (pre-op)*   Induction: Intravenous  PONV Risk Score and Plan: 3 and Ondansetron, Dexamethasone and Treatment may vary due to age or medical condition  Airway Management Planned: Oral ETT  Additional Equipment:   Intra-op Plan:   Post-operative Plan: Extubation in OR  Informed Consent: I have reviewed the patients History and Physical, chart, labs and discussed the procedure including the risks, benefits and alternatives for the proposed anesthesia with the patient or authorized representative who has indicated his/her understanding and acceptance.     Dental advisory given  Plan Discussed with: CRNA  Anesthesia Plan Comments: (See APP note by Joslyn Hy, FNP  Case cancelled by surgeon. Pt took Pradaxa on the morning of  surgery.)      Anesthesia Quick Evaluation

## 2021-11-26 NOTE — Progress Notes (Signed)
Anesthesia Chart Review:   Case: [id number] Date/Time: 11/30/21 1130   Procedure: REVERSE SHOULDER ARTHROPLASTY (Right: Shoulder) - 150   Anesthesia type: Choice   Pre-op diagnosis: Right shoulder massive rotator cuff tear   Location: [facility] ROOM 08 / [facility]   Surgeons: Yolonda Kida, MD       DISCUSSION: Pt is 76 years old with hx CAD (MI, DES to LAD 2021, prior LAD stent 2013), TIA, HTN, DM (diet controlled), parkinson disease, multiple DVT in  2010, hypercoagulable state  Hospitalized 6/2-09/24/21 for slurred speech and fall. CVA ruled out.   Pradaxa to be held for 5 days prior to surgery. Pt reports he is not taking plavix.    VS: BP 103/73   Pulse 65   Temp 36.4 C (Oral)   Resp 18   Ht 5\' 7"  (1.702 m)   Wt 98 kg   SpO2 97%   BMI 33.83 kg/m   PROVIDERS: - PCP is , MD - Cardiologist is Loyal Jacobson, MD who has cleared pt for surgery at low risk. Last office visit 04/04/21 (notes in care everywhere)  - Hematologist is 04/06/21, MD. Last office visit 09/27/20 (notes in care everywhere)    LABS: Labs reviewed: Acceptable for surgery. (all labs ordered are listed, but only abnormal results are displayed)  Labs Reviewed  HEMOGLOBIN A1C - Abnormal; Notable for the following components:      Result Value   Hgb A1c MFr Bld 6.0 (*)    All other components within normal limits  BASIC METABOLIC PANEL - Abnormal; Notable for the following components:   Glucose, Bld 117 (*)    BUN 29 (*)    Creatinine, Ser 1.25 (*)    Calcium 8.8 (*)    GFR, Estimated 60 (*)    All other components within normal limits  GLUCOSE, CAPILLARY - Abnormal; Notable for the following components:   Glucose-Capillary 116 (*)    All other components within normal limits  SURGICAL PCR SCREEN  CBC     IMAGES: CT head neck 09/21/21 (care everywhere): 1. No acute intracranial pathology.  ASPECTS is 10.  2. No infarct core or penumbra identified by CT perfusion.   3. No emergent large vessel occlusion or proximal high-grade stenosis.  4. Mild plaque at the carotid bifurcations without hemodynamically significant stenosis. Mild stenosis of the bilateral vertebral artery origins.  5. Calcified plaque in the left V4 segment resulting in up to moderate stenosis, and mild plaque in the intracranial ICAs.  Otherwise, patent intracranial vasculature.    EKG 11/23/21: Sinus bradycardia with 1st degree A-V block. Left axis deviation. Possible Anteroseptal infarct , age undetermined   CV: Echo 09/22/21 (care everywhere):  - Left ventricular systolic function is normal. LV ejection fraction = 60-65%.  - No segmental wall motion abnormalities seen in the left ventricle  - Mild left ventricular hypertrophy  - There is mild diastolic dysfunction, Grade IA, with normal left atrial  pressure.  - The right ventricle is mildly dilated.  - The right ventricular systolic function is normal.  - IVC size was moderately dilated.  - There is no pericardial effusion.  - There is no comparison study available.   Cardiac cath 09/27/19:  Mid Cx lesion is 60% stenosed. Prox RCA lesion is 50% stenosed. LV end diastolic pressure is mildly elevated. Prox LAD to Mid LAD lesion is 20% stenosed. Prox LAD lesion is 95% stenosed. Post intervention, there is a 0% residual stenosis. A  drug-eluting stent was successfully placed using a STENT RESOLUTE ONYX 3.5X15. 1. Single vessel obstructive CAD with 95% stenosis in the proximal LAD. This appears to be proximal to old stent. 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  2. Mildly elevated LVEDP 19 mm Hg 3. Successful PCI of the proximal LAD with DES x 1 using IVUS guidance.   Past Medical History:  Diagnosis Date   Acquired pes planus, left 06/02/2018   ACS (acute coronary syndrome) (HCC) 09/25/2019   Antiphospholipid antibody syndrome (HCC) 09/28/2016   Atopic dermatitis 09/09/2016   Benign  essential tremor 07/04/2015   Cataract cortical, senile, bilateral 02/27/2016   Chest pain 07/11/2015   Chronic pain of right ankle 06/07/2016   Coronary artery disease involving native coronary artery 07/11/2015   Current use of long term anticoagulation 12/24/2013   DDD (degenerative disc disease), cervical 07/16/2013   Formatting of this note might be different from the original. STORY: MRI C spine revealed multilevel DDD without disc protrution or impingement. Severe right facet arthritis C 2-3, moderate severe right facet arthritis C7-T1.  Discussed with patient in legnth, he's not satisfied with the result of pain control. Will refer him to see Dr. Loletta Parish to consider facet block and will increase HCE to 10t   Diabetes mellitus without complication (HCC)    Diet controlled no meds   Diplopia 12/17/2016   DVT of leg (deep venous thrombosis) (HCC) 12/24/2013   Formatting of this note might be different from the original. Three times in left lower leg   Erectile dysfunction 09/09/2016   Essential hypertension 12/24/2013   GERD (gastroesophageal reflux disease) 12/24/2013   Formatting of this note might be different from the original. Barrett's Esophagus with dysplasia, prior ablations at [facility] of this note might be different from the original. Overview:  Barrett's Esophagus with dysplasia, prior ablations at [facility]   History of Barrett's esophagus 07/11/2015   History of MI (myocardial infarction) 04/21/2017   Hypercoagulable state (HCC) 04/21/2017   Formatting of this note might be different from the original. 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 STORY: DDx:DDD, cervical spondylosis, facet arthropathy, radiculopathy.   Nontraumatic rupture of left posterior tibial tendon 01/18/2016   NSTEMI (non-ST elevated myocardial infarction) (HCC) 09/25/2019   Nuclear sclerotic cataract of both eyes 02/27/2016   Obesity (BMI 30-39.9) 07/04/2015   Pain of upper abdomen 07/11/2015   Parkinson disease (HCC) 04/03/2016   Right arm pain 06/03/2019   Sacroiliitis (HCC) 02/25/2018   Stroke (HCC) 11/02/2021   TIA   Tenosynovitis of right ankle 06/07/2016   Tibialis posterior tendinitis, left 06/02/2018    Past Surgical History:  Procedure Laterality Date   CORONARY ANGIOPLASTY WITH STENT PLACEMENT  2013   with stents   CORONARY STENT INTERVENTION N/A 09/27/2019   Procedure: CORONARY STENT INTERVENTION;  Surgeon: Swaziland, Peter M, MD;  Location: [facility] INVASIVE CV LAB;  Service: Cardiovascular;  Laterality: N/A;   FINGER SURGERY Bilateral 2009   remove arthritis from index fingers   INTRAVASCULAR ULTRASOUND/IVUS N/A 09/27/2019   Procedure: Intravascular Ultrasound/IVUS;  Surgeon: Swaziland, Peter M, MD;  Location: [facility] INVASIVE CV LAB;  Service: Cardiovascular;  Laterality: N/A;   LEFT HEART CATH AND CORONARY ANGIOGRAPHY N/A 09/27/2019   Procedure: LEFT HEART CATH AND CORONARY ANGIOGRAPHY;  Surgeon: Swaziland, Peter M, MD;  Location: [facility] INVASIVE CV LAB;  Service: Cardiovascular;  Laterality: N/A;   LUMBAR LAMINECTOMY  2018   ULNAR NERVE TRANSPOSITION Left 2011    MEDICATIONS:  acetaminophen (TYLENOL) 500 MG tablet   ALPRAZolam (XANAX) 1 MG tablet   amLODipine (NORVASC) 5 MG tablet   aspirin EC 325 MG tablet    atorvastatin (LIPITOR) 40 MG tablet   atorvastatin (LIPITOR) 80 MG tablet   buPROPion (WELLBUTRIN XL) 300 MG 24 hr tablet   carbidopa-levodopa (SINEMET IR) 25-100 MG tablet   clopidogrel (PLAVIX) 75 MG tablet   dabigatran (PRADAXA) 150 MG CAPS capsule   gabapentin (NEURONTIN) 800 MG tablet   hydrochlorothiazide (HYDRODIURIL) 25 MG tablet   HYDROcodone-acetaminophen (NORCO/VICODIN) 5-325 MG tablet   irbesartan (AVAPRO) 300 MG tablet   magnesium oxide (MAG-OX) 400 (240 Mg) MG tablet   meclizine (ANTIVERT) 25 MG tablet   nitroGLYCERIN (NITROSTAT) 0.4 MG SL tablet   oxycodone (OXY-IR) 5 MG capsule   pantoprazole (PROTONIX) 40 MG tablet   potassium chloride (KLOR-CON) 10 MEQ tablet   No current facility-administered medications for this encounter.    If no changes, I anticipate pt can proceed with surgery as scheduled.   Rica Mast, PhD, FNP-BC [facility] Short Stay Surgical Center/Anesthesiology Phone: [phone] 11/26/2021 12:27 PM

## 2021-11-29 NOTE — Progress Notes (Signed)
Pt aware to arrive at [facility] admitting at 0830 for scheduled surgical procedure on Friday 11/30/2021. No food after midnight; clear liquids from midnight till 0815 consuming entire pre surgery drink by 0815 then nothing by mouth.

## 2021-11-30 ENCOUNTER — Ambulatory Visit (HOSPITAL_COMMUNITY)
Admission: RE | Admit: 2021-11-30 | Discharge: 2021-11-30 | Disposition: A | Discharge status: Home / Self Care | Payer: Medicare HMO | Source: Ambulatory Visit | Attending: Orthopedic Surgery | Admitting: Orthopedic Surgery

## 2021-11-30 ENCOUNTER — Encounter (HOSPITAL_COMMUNITY): Payer: Self-pay | Admitting: Orthopedic Surgery

## 2021-11-30 ENCOUNTER — Encounter (HOSPITAL_COMMUNITY)
Admission: RE | Disposition: A | Discharge status: Home / Self Care | Payer: Self-pay | Source: Ambulatory Visit | Attending: Orthopedic Surgery

## 2021-11-30 DIAGNOSIS — I252 Old myocardial infarction: Secondary | ICD-10-CM | POA: Insufficient documentation

## 2021-11-30 DIAGNOSIS — Z8673 Personal history of transient ischemic attack (TIA), and cerebral infarction without residual deficits: Secondary | ICD-10-CM | POA: Diagnosis not present

## 2021-11-30 DIAGNOSIS — I10 Essential (primary) hypertension: Secondary | ICD-10-CM | POA: Diagnosis not present

## 2021-11-30 DIAGNOSIS — E119 Type 2 diabetes mellitus without complications: Secondary | ICD-10-CM | POA: Insufficient documentation

## 2021-11-30 DIAGNOSIS — Z01818 Encounter for other preprocedural examination: Secondary | ICD-10-CM

## 2021-11-30 DIAGNOSIS — M25511 Pain in right shoulder: Secondary | ICD-10-CM | POA: Diagnosis present

## 2021-11-30 DIAGNOSIS — I2510 Atherosclerotic heart disease of native coronary artery without angina pectoris: Secondary | ICD-10-CM | POA: Diagnosis not present

## 2021-11-30 DIAGNOSIS — M1990 Unspecified osteoarthritis, unspecified site: Secondary | ICD-10-CM | POA: Diagnosis not present

## 2021-11-30 DIAGNOSIS — M5030 Other cervical disc degeneration, unspecified cervical region: Secondary | ICD-10-CM

## 2021-11-30 DIAGNOSIS — K219 Gastro-esophageal reflux disease without esophagitis: Secondary | ICD-10-CM | POA: Insufficient documentation

## 2021-11-30 DIAGNOSIS — R531 Weakness: Secondary | ICD-10-CM | POA: Diagnosis not present

## 2021-11-30 DIAGNOSIS — Z538 Procedure and treatment not carried out for other reasons: Secondary | ICD-10-CM | POA: Diagnosis not present

## 2021-11-30 SURGERY — ARTHROPLASTY, SHOULDER, TOTAL, REVERSE
Anesthesia: General

## 2021-11-30 MED ORDER — VANCOMYCIN HCL 1000 MG IV SOLR
INTRAVENOUS | Status: AC
Start: 2021-11-30 — End: ?
  Filled 2021-11-30: qty 20

## 2021-11-30 MED ORDER — FENTANYL CITRATE (PF) 100 MCG/2ML IJ SOLN
INTRAMUSCULAR | Status: AC
  Filled 2021-11-30: qty 2

## 2021-11-30 MED ORDER — LACTATED RINGERS IV SOLN: INTRAVENOUS | Status: DC

## 2021-11-30 MED ORDER — ORAL CARE MOUTH RINSE: 15.0000 mL | Freq: Once | OROMUCOSAL | Status: AC

## 2021-11-30 MED ORDER — FENTANYL CITRATE PF 50 MCG/ML IJ SOSY
50.0000 ug | PREFILLED_SYRINGE | INTRAMUSCULAR | Status: DC
  Filled 2021-11-30: qty 2

## 2021-11-30 MED ORDER — TRANEXAMIC ACID-NACL 1000-0.7 MG/100ML-% IV SOLN
1000.0000 mg | INTRAVENOUS | Status: DC
  Administered 2021-12-06: 1000 mg via INTRAVENOUS
  Filled 2021-11-30: qty 100

## 2021-11-30 MED ORDER — ACETAMINOPHEN 500 MG PO TABS
1000.0000 mg | ORAL_TABLET | Freq: Once | ORAL | Status: AC
  Administered 2021-11-30: 1000 mg via ORAL
  Filled 2021-11-30: qty 2

## 2021-11-30 MED ORDER — PHENYLEPHRINE 80 MCG/ML (10ML) SYRINGE FOR IV PUSH (FOR BLOOD PRESSURE SUPPORT)
PREFILLED_SYRINGE | INTRAVENOUS | Status: AC
  Filled 2021-11-30: qty 20

## 2021-11-30 MED ORDER — PROPOFOL 10 MG/ML IV BOLUS
INTRAVENOUS | Status: AC
  Filled 2021-11-30: qty 20

## 2021-11-30 MED ORDER — ONDANSETRON HCL 4 MG/2ML IJ SOLN
INTRAMUSCULAR | Status: AC
  Filled 2021-11-30: qty 2

## 2021-11-30 MED ORDER — DEXAMETHASONE SODIUM PHOSPHATE 10 MG/ML IJ SOLN
INTRAMUSCULAR | Status: AC
Start: 2021-11-30 — End: ?
  Filled 2021-11-30: qty 1

## 2021-11-30 MED ORDER — CHLORHEXIDINE GLUCONATE 0.12 % MT SOLN
15.0000 mL | Freq: Once | OROMUCOSAL | Status: AC
  Administered 2021-11-30: 15 mL via OROMUCOSAL

## 2021-11-30 MED ORDER — CEFAZOLIN SODIUM-DEXTROSE 2-4 GM/100ML-% IV SOLN
2.0000 g | INTRAVENOUS | Status: DC
  Administered 2021-12-06: 2 g via INTRAVENOUS
  Filled 2021-11-30: qty 100

## 2021-11-30 MED ORDER — MIDAZOLAM HCL 2 MG/2ML IJ SOLN
1.0000 mg | INTRAMUSCULAR | Status: DC
  Filled 2021-11-30: qty 2

## 2021-11-30 SURGICAL SUPPLY — 53 items
AID PSTN UNV HD RSTRNT DISP (MISCELLANEOUS)
BAG COUNTER SPONGE SURGICOUNT (BAG) IMPLANT
BAG SPEC THK2 15X12 ZIP CLS (MISCELLANEOUS) ×1
BAG SPNG CNTER NS LX DISP (BAG)
BAG ZIPLOCK 12X15 (MISCELLANEOUS) ×3 IMPLANT
BLADE SAG 18X100X1.27 (BLADE) ×3 IMPLANT
COVER BACK TABLE 60X90IN (DRAPES) ×3 IMPLANT
COVER SURGICAL LIGHT HANDLE (MISCELLANEOUS) ×3 IMPLANT
DRAPE ORTHO SPLIT 77X108 STRL (DRAPES) ×4
DRAPE SHEET LG 3/4 BI-LAMINATE (DRAPES) ×3 IMPLANT
DRAPE SURG 17X11 SM STRL (DRAPES) ×3 IMPLANT
DRAPE SURG ORHT 6 SPLT 77X108 (DRAPES) ×4 IMPLANT
DRAPE TOP 10253 STERILE (DRAPES) ×3 IMPLANT
DRAPE U-SHAPE 47X51 STRL (DRAPES) ×3 IMPLANT
DRSG AQUACEL AG ADV 3.5X 6 (GAUZE/BANDAGES/DRESSINGS) IMPLANT
DRSG AQUACEL AG ADV 3.5X10 (GAUZE/BANDAGES/DRESSINGS) ×3 IMPLANT
DURAPREP 26ML APPLICATOR (WOUND CARE) IMPLANT
ELECT REM PT RETURN 15FT ADLT (MISCELLANEOUS) ×3 IMPLANT
FACESHIELD WRAPAROUND (MASK) ×2 IMPLANT
FACESHIELD WRAPAROUND OR TEAM (MASK) ×1 IMPLANT
GLOVE BIO SURGEON STRL SZ7.5 (GLOVE) ×12 IMPLANT
GLOVE BIOGEL PI IND STRL 8 (GLOVE) ×4 IMPLANT
GLOVE BIOGEL PI INDICATOR 8 (GLOVE) ×2
GOWN STRL REUS W/ TWL XL LVL3 (GOWN DISPOSABLE) ×4 IMPLANT
GOWN STRL REUS W/TWL XL LVL3 (GOWN DISPOSABLE) ×4
KIT BASIN OR (CUSTOM PROCEDURE TRAY) ×3 IMPLANT
KIT TURNOVER KIT A (KITS) IMPLANT
MANIFOLD NEPTUNE II (INSTRUMENTS) ×3 IMPLANT
NDL TAPERED W/ NITINOL LOOP (MISCELLANEOUS) IMPLANT
NEEDLE TAPERED W/ NITINOL LOOP (MISCELLANEOUS) IMPLANT
NS IRRIG 1000ML POUR BTL (IV SOLUTION) ×3 IMPLANT
PACK SHOULDER (CUSTOM PROCEDURE TRAY) ×3 IMPLANT
PROTECTOR NERVE ULNAR (MISCELLANEOUS) ×3 IMPLANT
RESTRAINT HEAD UNIVERSAL NS (MISCELLANEOUS) IMPLANT
SLING ARM FOAM STRAP MED (SOFTGOODS) IMPLANT
SMARTMIX MINI TOWER (MISCELLANEOUS)
SPONGE T-LAP 4X18 ~~LOC~~+RFID (SPONGE) IMPLANT
STRIP CLOSURE SKIN 1/2X4 (GAUZE/BANDAGES/DRESSINGS) ×3 IMPLANT
SUCTION FRAZIER HANDLE 10FR (MISCELLANEOUS) ×1
SUCTION TUBE FRAZIER 10FR DISP (MISCELLANEOUS) ×2 IMPLANT
SUT FIBERWIRE #2 38 T-5 BLUE (SUTURE)
SUT MON AB 3-0 SH 27 (SUTURE) ×2
SUT MON AB 3-0 SH27 (SUTURE) ×2 IMPLANT
SUT VIC AB 0 CT1 36 (SUTURE) ×3 IMPLANT
SUT VIC AB 1 CT1 36 (SUTURE) ×3 IMPLANT
SUT VIC AB 2-0 CT1 27 (SUTURE) ×2
SUT VIC AB 2-0 CT1 TAPERPNT 27 (SUTURE) ×2 IMPLANT
SUTURE FIBERWR #2 38 T-5 BLUE (SUTURE) IMPLANT
SUTURE TAPE 1.3 40 TPR END (SUTURE) ×4 IMPLANT
SUTURETAPE 1.3 40 TPR END (SUTURE) ×4
TOWEL OR 17X26 10 PK STRL BLUE (TOWEL DISPOSABLE) ×3 IMPLANT
TOWER SMARTMIX MINI (MISCELLANEOUS) IMPLANT
TUBE SUCTION HIGH CAP CLEAR NV (SUCTIONS) ×3 IMPLANT

## 2021-11-30 NOTE — Progress Notes (Addendum)
Surgical Instructions   Your procedure is scheduled on Thursday, August 17th.   Report to [facility] Main Entrance "A" at 11:15 A.M., then check in with the Admitting office.   Call this number if you have problems the morning of surgery: [phone].                If you have any questions prior to your surgery date call [phone]: Open Monday-Friday 8am-4pm    Remember:     Do not eat after midnight the night before your surgery  The day of surgery:  Drink ONE G2 at  10:15 AM the morning of surgery. Drink in one sitting. Do not sip.  This drink was given to you during your hospital  pre-op appointment visit. Nothing else to drink after completing the  G2. At 10:15 AM  You may drink clear liquids until 10:15 the morning of your surgery.   Clear liquids allowed are: Water, Non-Citrus Juices (without pulp), Carbonated Beverages, Clear Tea, Black Coffee ONLY (NO MILK, CREAM OR POWDERED CREAMER of any kind), and Gatorade     Take these medicines the morning of surgery with A SIP OF WATER:   -Alprazolam (Xanax) if needed -Atorvastatin (Lipitor) -Bupropion (Wellbutrin) -Hydrocodone-Acetaminophen (Norco/Vicodin) if needed -Pantoprazole (Protonix) -Acetaminophen (Tylenol) if needed   As of today, STOP taking any Aspirin (unless otherwise instructed by your surgeon) Aleve, Naproxen, Ibuprofen, Motrin, Advil, Goody's, BC's, all herbal medications, fish oil, and all vitamins.  Follow your surgeon's instructions on when to stop taking your Pradaxa and Aspirin.             Do not wear jewelry or makeup. Do not wear lotions, powders, perfumes/cologne or deodorant. Do not shave 48 hours prior to surgery.  Men may shave face and neck. Do not bring valuables to the hospital. Do not wear nail polish, gel polish, artificial nails, or any other type of covering on natural nails (fingers and toes) If you have artificial nails or gel coating that need to be removed by a nail salon, please  have this removed prior to surgery. Artificial nails or gel coating may interfere with anesthesia's ability to adequately monitor your vital signs.  [facility] is not responsible for any belongings or valuables.    Do NOT Smoke (Tobacco/Vaping)  24 hours prior to your procedure  If you use a CPAP at night, you may bring your mask for your overnight stay.   Contacts, glasses, hearing aids, dentures or partials may not be worn into surgery, please bring cases for these belongings   For patients admitted to the hospital, discharge time will be determined by your treatment team.   Patients discharged the day of surgery will not be allowed to drive home, and someone needs to stay with them for 24 hours.   SURGICAL WAITING ROOM VISITATION Patients having surgery or a procedure may have no more than 2 support people in the waiting area - these visitors may rotate.   Children under the age of 76 must have an adult with them who is not the patient. If the patient needs to stay at the hospital during part of their recovery, the visitor guidelines for inpatient rooms apply. Pre-op nurse will coordinate an appropriate time for 1 support person to accompany patient in pre-op.  This support person may not rotate.   Please refer to the [facility] website for the visitor guidelines for Inpatients (after your surgery is over and you are in a regular room).  Special instructions:     Oral Hygiene is also important to reduce your risk of infection.  Remember - BRUSH YOUR TEETH THE MORNING OF SURGERY WITH YOUR REGULAR TOOTHPASTE           [facility]- Preparing for Total Shoulder Arthroplasty    Before surgery, you can play an important role. Because skin is not sterile, your skin needs to be as free of germs as possible. You can reduce the number of germs on your skin by using the following products. Benzoyl Peroxide Gel Reduces the number of germs present on the skin Applied twice a day  to shoulder area starting two days before surgery     ==================================================================   Please follow these instructions carefully:   BENZOYL PEROXIDE 5% GEL   Please do not use if you have an allergy to benzoyl peroxide.   If your skin becomes reddened/irritated stop using the benzoyl peroxide.   Starting two days before surgery, apply as follows: Apply benzoyl peroxide in the morning and at night. Apply after taking a shower. If you are not taking a shower clean entire shoulder front, back, and side along with the armpit with a clean wet washcloth.   Place a quarter-sized dollop on your shoulder and rub in thoroughly, making sure to cover the front, back, and side of your shoulder, along with the armpit.    2 days before (Tuesday)  ____ AM   ____ PM               1 day before (Wednesday)  ____ AM   ____ PM                         Do this twice a day for two days.  (Last application is the night before surgery, AFTER using the CHG soap as described below).   Do NOT apply benzoyl peroxide gel on the day of surgery.  - Do not apply on Thursday, August 17th.      Please see attached page for CHG soap instructions to use the night before and the morning of surgery.      If you received a COVID test during your pre-op visit, it is requested that you wear a mask when out in public, stay away from anyone that may not be feeling well, and notify your surgeon if you develop symptoms. If you have been in contact with anyone that has tested positive in the last 10 days, please notify your surgeon.    Please read over the following fact sheets that you were given.

## 2021-11-30 NOTE — H&P (Addendum)
ORTHOPAEDIC H and P  REQUESTING PHYSICIAN: Yolonda Kida, MD  PCP:  Loyal Jacobson, MD  Chief Complaint: Right rotator cuff arthropathy  HPI: [name]. is a 76 y.o. male who complains of right shoulder pain and weakness.  Here today for reverse arthroplasty for definitive treatment for compensated rotator cuff arthropathy.  No new complaints today.  Ready to proceed with surgery.  Past Medical History:  Diagnosis Date   Acquired pes planus, left 06/02/2018   ACS (acute coronary syndrome) (HCC) 09/25/2019   Antiphospholipid antibody syndrome (HCC) 09/28/2016   Atopic dermatitis 09/09/2016   Benign essential tremor 07/04/2015   Cataract cortical, senile, bilateral 02/27/2016   Chest pain 07/11/2015   Chronic pain of right ankle 06/07/2016   Coronary artery disease involving native coronary artery 07/11/2015   Current use of long term anticoagulation 12/24/2013   DDD (degenerative disc disease), cervical 07/16/2013   Formatting of this note might be different from the original. STORY: MRI C spine revealed multilevel DDD without disc protrution or impingement. Severe right facet arthritis C 2-3, moderate severe right facet arthritis C7-T1.  Discussed with patient in legnth, he's not satisfied with the result of pain control. Will refer him to see Dr. Loletta Parish to consider facet block and will increase HCE to 10t   Diabetes mellitus without complication (HCC)    Diet controlled no meds   Diplopia 12/17/2016   DVT of leg (deep venous thrombosis) (HCC) 12/24/2013   Formatting of this note might be different from the original. Three times in left lower leg   Erectile dysfunction 09/09/2016   Essential hypertension 12/24/2013   GERD (gastroesophageal reflux disease) 12/24/2013   Formatting of this note might be different from the original. Barrett's Esophagus with dysplasia, prior ablations at [facility] of this note might be different from the original. Overview:   Barrett's Esophagus with dysplasia, prior ablations at [facility]   History of Barrett's esophagus 07/11/2015   History of MI (myocardial infarction) 04/21/2017   Hypercoagulable state (HCC) 04/21/2017   Formatting of this note might be different from the original. History of anti-Cardiolite and antibody positive with DVT in 2002 following casting and 2008 following an MI by multiple mini strokes that occurred despite aspirin therapy for which she was switched to Coumadin with difficulty maintaining his INR and then complicated by his third DVT unprovoked   Hyperlipidemia, unspecified 12/24/2013   Impaired functional mobility, balance, gait, and endurance 11/10/2019   Insomnia 07/04/2015   Lumbar disc herniation with radiculopathy 10/17/2017   Major depressive disorder, recurrent, in remission (HCC) 07/04/2015   Mild cognitive impairment 07/11/2015   Myocardial infarction [facility])    Neck pain 07/16/2013   Formatting of this note might be different from the original. STORY: DDx:DDD, cervical spondylosis, facet arthropathy, radiculopathy.   Nontraumatic rupture of left posterior tibial tendon 01/18/2016   NSTEMI (non-ST elevated myocardial infarction) (HCC) 09/25/2019   Nuclear sclerotic cataract of both eyes 02/27/2016   Obesity (BMI 30-39.9) 07/04/2015   Pain of upper abdomen 07/11/2015   Parkinson disease (HCC) 04/03/2016   Right arm pain 06/03/2019   Sacroiliitis (HCC) 02/25/2018   Stroke (HCC) 11/02/2021   TIA   Tenosynovitis of right ankle 06/07/2016   Tibialis posterior tendinitis, left 06/02/2018   Past Surgical History:  Procedure Laterality Date   CORONARY ANGIOPLASTY WITH STENT PLACEMENT  2013   with stents   CORONARY STENT INTERVENTION N/A 09/27/2019   Procedure: CORONARY STENT INTERVENTION;  Surgeon: Swaziland, Peter M,  MD;  Location: [facility] INVASIVE CV LAB;  Service: Cardiovascular;  Laterality: N/A;   FINGER SURGERY Bilateral 2009   remove arthritis from index fingers   INTRAVASCULAR  ULTRASOUND/IVUS N/A 09/27/2019   Procedure: Intravascular Ultrasound/IVUS;  Surgeon: Swaziland, Peter M, MD;  Location: [facility] INVASIVE CV LAB;  Service: Cardiovascular;  Laterality: N/A;   LEFT HEART CATH AND CORONARY ANGIOGRAPHY N/A 09/27/2019   Procedure: LEFT HEART CATH AND CORONARY ANGIOGRAPHY;  Surgeon: Swaziland, Peter M, MD;  Location: [facility] INVASIVE CV LAB;  Service: Cardiovascular;  Laterality: N/A;   LUMBAR LAMINECTOMY  2018   ULNAR NERVE TRANSPOSITION Left 2011   Social History   Socioeconomic History   Marital status: Married    Spouse name: Not on file   Number of children: Not on file   Years of education: Not on file   Highest education level: Not on file  Occupational History   Not on file  Tobacco Use   Smoking status: Never   Smokeless tobacco: Never  Vaping Use   Vaping Use: Never used  Substance and Sexual Activity   Alcohol use: Yes    Alcohol/week: 7.0 standard drinks of alcohol    Types: 7 Standard drinks or equivalent per week    Comment: daily   Drug use: No   Sexual activity: Yes  Other Topics Concern   Not on file  Social History Narrative   Not on file   Social Determinants of Health   Financial Resource Strain: Not on file  Food Insecurity: Not on file  Transportation Needs: Not on file  Physical Activity: Not on file  Stress: Not on file  Social Connections: Not on file   Family History  Problem Relation Age of Onset   CAD Father 68       Died of MI   CAD Brother 50       MI   Allergies  Allergen Reactions   Lisinopril Cough   Prior to Admission medications   Medication Sig Start Date End Date Taking? Authorizing Provider  acetaminophen (TYLENOL) 500 MG tablet Take 1,000 mg by mouth every 6 (six) hours as needed for moderate pain.   Yes [provider]  ALPRAZolam Prudy Feeler) 1 MG tablet Take 0.5 mg by mouth 3 (three) times daily as needed for anxiety.    Yes [provider]  amLODipine (NORVASC) 5 MG tablet Take 1 tablet (5 mg  total) by mouth daily. Patient taking differently: Take 5 mg by mouth at bedtime. 10/06/19  Yes Jodelle Gross, NP  aspirin EC 325 MG tablet Take 325 mg by mouth daily.   Yes [provider]  atorvastatin (LIPITOR) 40 MG tablet Take 40 mg by mouth daily.   Yes [provider]  buPROPion (WELLBUTRIN XL) 300 MG 24 hr tablet Take 300 mg by mouth daily.   Yes [provider]  carbidopa-levodopa (SINEMET IR) 25-100 MG tablet Take 2 tablets by mouth in the morning and at bedtime. As directed 11/05/19  Yes [provider]  dabigatran (PRADAXA) 150 MG CAPS capsule Take 150 mg by mouth 2 (two) times daily.   Yes [provider]  gabapentin (NEURONTIN) 800 MG tablet Take 800-1,600 mg by mouth See admin instructions. Take 800 mg by mouth in the morning and 1600 mg at bedtime   Yes [provider]  hydrochlorothiazide (HYDRODIURIL) 25 MG tablet TAKE 1 TABLET EVERY DAY (NEED APPOINTMENT FOR FURTHER REFILLS) 03/19/21  Yes Tobb, Kardie, DO  HYDROcodone-acetaminophen (NORCO/VICODIN) 5-325 MG  tablet Take 1 tablet by mouth every 6 (six) hours as needed for moderate pain. 09/01/19  Yes [provider]  irbesartan (AVAPRO) 300 MG tablet Take 1 tablet (300 mg total) by mouth daily. 09/28/19  Yes Furth, Cadence H, PA-C  magnesium oxide (MAG-OX) 400 (240 Mg) MG tablet Take 400 mg by mouth daily as needed (cramping).   Yes [provider]  meclizine (ANTIVERT) 25 MG tablet Take 25 mg by mouth 3 (three) times daily as needed for dizziness.   Yes [provider]  nitroGLYCERIN (NITROSTAT) 0.4 MG SL tablet Place 1 tablet (0.4 mg total) under the tongue every 5 (five) minutes as needed for chest pain. 10/06/19 11/20/21 Yes Jodelle Gross, NP  oxycodone (OXY-IR) 5 MG capsule Take 5 mg by mouth every 4 (four) hours as needed for pain.    Yes [provider]  pantoprazole (PROTONIX) 40 MG tablet Take 40 mg by mouth daily.   Yes [provider]  potassium chloride (KLOR-CON) 10 MEQ tablet Take 10 mEq by mouth daily. 10/10/21  Yes [provider]  atorvastatin (LIPITOR) 80 MG tablet Take 1 tablet (80 mg total) by mouth daily. Patient not taking: Reported on 11/20/2021 09/28/19   Furth, Cadence H, PA-C  clopidogrel (PLAVIX) 75 MG tablet Take 1 tablet (75 mg total) by mouth daily with breakfast. Patient not taking: Reported on 11/20/2021 02/15/20   Tobb, Kardie, DO   No results found.  Positive ROS: All other systems have been reviewed and were otherwise negative with the exception of those mentioned in the HPI and as above.  Physical Exam: General: Alert, no acute distress Cardiovascular: No pedal edema Respiratory: No cyanosis, no use of accessory musculature GI: No organomegaly, abdomen is soft and non-tender Skin: No lesions in the area of chief complaint Neurologic: Sensation intact distally Psychiatric: Patient is competent for consent with normal mood and affect Lymphatic: No axillary or cervical lymphadenopathy  MUSCULOSKELETAL:   Right upper extremity is warm and well-perfused.  No open wounds or lesions.  He is neurovascularly intact.  Assessment: Right shoulder rotator cuff arthropathy  Plan: Plan for reverse arthroplasty today of the right shoulder.  We again discussed the risk of bleeding, infection, damage to surrounding nerves and vessels, stiffness, failure of pain relief, dislocation, fracture, need for revision surgery, as well as the risk of anesthesia.  He has provided informed consent.  -Our plan will be to discharge home today from PACU.  Certainly if he has any intraoperative issues of shortness of breath in PACU we will keep him overnight.    Yolonda Kida, MD Cell [phone]    11/30/2021 9:15 AM   Update:  Unfortunately, case today is cancelled due to patient mistakenly taking pradaxa.  Will re-schedule.

## 2021-12-04 NOTE — Progress Notes (Signed)
Called patient to go over medications for surgery, arrival time, and any additional questions.  Patient verbalized medications and arrival time.  Patient stated that he received a phone call this morning to collect money for surgery but that he had paid the money at [facility].  Nurse has reached out to office of patient experience and pre-service center.

## 2021-12-06 ENCOUNTER — Other Ambulatory Visit: Payer: Self-pay

## 2021-12-06 ENCOUNTER — Encounter (HOSPITAL_COMMUNITY)
Admission: RE | Disposition: A | Discharge status: Home / Self Care | Payer: Self-pay | Source: Home / Self Care | Attending: Orthopedic Surgery

## 2021-12-06 ENCOUNTER — Observation Stay (HOSPITAL_COMMUNITY): Payer: Medicare HMO

## 2021-12-06 ENCOUNTER — Observation Stay (HOSPITAL_COMMUNITY)
Admission: RE | Admit: 2021-12-06 | Discharge: 2021-12-07 | Disposition: A | Discharge status: Home / Self Care | Payer: Medicare HMO | Attending: Orthopedic Surgery | Admitting: Orthopedic Surgery

## 2021-12-06 ENCOUNTER — Encounter (HOSPITAL_COMMUNITY): Payer: Self-pay | Admitting: Orthopedic Surgery

## 2021-12-06 ENCOUNTER — Ambulatory Visit (HOSPITAL_BASED_OUTPATIENT_CLINIC_OR_DEPARTMENT_OTHER): Payer: Medicare HMO | Admitting: Certified Registered Nurse Anesthetist

## 2021-12-06 ENCOUNTER — Ambulatory Visit (HOSPITAL_COMMUNITY): Payer: Medicare HMO | Admitting: Emergency Medicine

## 2021-12-06 DIAGNOSIS — Z538 Procedure and treatment not carried out for other reasons: Secondary | ICD-10-CM | POA: Diagnosis not present

## 2021-12-06 DIAGNOSIS — I2510 Atherosclerotic heart disease of native coronary artery without angina pectoris: Secondary | ICD-10-CM | POA: Insufficient documentation

## 2021-12-06 DIAGNOSIS — I10 Essential (primary) hypertension: Secondary | ICD-10-CM | POA: Insufficient documentation

## 2021-12-06 DIAGNOSIS — E119 Type 2 diabetes mellitus without complications: Secondary | ICD-10-CM | POA: Diagnosis not present

## 2021-12-06 DIAGNOSIS — G20 Parkinson's disease: Secondary | ICD-10-CM | POA: Insufficient documentation

## 2021-12-06 DIAGNOSIS — Z7902 Long term (current) use of antithrombotics/antiplatelets: Secondary | ICD-10-CM | POA: Insufficient documentation

## 2021-12-06 DIAGNOSIS — Z86718 Personal history of other venous thrombosis and embolism: Secondary | ICD-10-CM | POA: Diagnosis not present

## 2021-12-06 DIAGNOSIS — Z79899 Other long term (current) drug therapy: Secondary | ICD-10-CM | POA: Insufficient documentation

## 2021-12-06 DIAGNOSIS — I252 Old myocardial infarction: Secondary | ICD-10-CM

## 2021-12-06 DIAGNOSIS — Z955 Presence of coronary angioplasty implant and graft: Secondary | ICD-10-CM | POA: Diagnosis not present

## 2021-12-06 DIAGNOSIS — M75101 Unspecified rotator cuff tear or rupture of right shoulder, not specified as traumatic: Secondary | ICD-10-CM

## 2021-12-06 DIAGNOSIS — Z96611 Presence of right artificial shoulder joint: Secondary | ICD-10-CM

## 2021-12-06 DIAGNOSIS — Z8673 Personal history of transient ischemic attack (TIA), and cerebral infarction without residual deficits: Secondary | ICD-10-CM | POA: Diagnosis not present

## 2021-12-06 DIAGNOSIS — Z7982 Long term (current) use of aspirin: Secondary | ICD-10-CM | POA: Diagnosis not present

## 2021-12-06 HISTORY — PX: REVERSE SHOULDER ARTHROPLASTY: SHX5054

## 2021-12-06 LAB — GLUCOSE, CAPILLARY
Glucose-Capillary: 116 mg/dL — ABNORMAL HIGH (ref 70–99)
Glucose-Capillary: 94 mg/dL (ref 70–99)
Glucose-Capillary: 87 mg/dL (ref 70–99)
Glucose-Capillary: 115 mg/dL — ABNORMAL HIGH (ref 70–99)

## 2021-12-06 SURGERY — ARTHROPLASTY, SHOULDER, TOTAL, REVERSE
Anesthesia: General | Site: Shoulder | Laterality: Right

## 2021-12-06 MED ORDER — AMLODIPINE BESYLATE 5 MG PO TABS
5.0000 mg | ORAL_TABLET | Freq: Every day | ORAL | Status: DC
  Administered 2021-12-06: 5 mg via ORAL
  Filled 2021-12-06: qty 1

## 2021-12-06 MED ORDER — AMISULPRIDE (ANTIEMETIC) 5 MG/2ML IV SOLN: 10.0000 mg | Freq: Once | INTRAVENOUS | Status: DC | PRN

## 2021-12-06 MED ORDER — TRANEXAMIC ACID-NACL 1000-0.7 MG/100ML-% IV SOLN
1000.0000 mg | INTRAVENOUS | Status: DC
  Filled 2021-12-06: qty 100

## 2021-12-06 MED ORDER — HYDROCODONE-ACETAMINOPHEN 5-325 MG PO TABS
ORAL_TABLET | ORAL | Status: AC
  Administered 2021-12-07: 1 via ORAL
  Filled 2021-12-06: qty 2

## 2021-12-06 MED ORDER — FENTANYL CITRATE (PF) 100 MCG/2ML IJ SOLN
INTRAMUSCULAR | Status: DC | PRN
  Administered 2021-12-06 (×3): 50 ug via INTRAVENOUS

## 2021-12-06 MED ORDER — PANTOPRAZOLE SODIUM 40 MG PO TBEC
40.0000 mg | DELAYED_RELEASE_TABLET | Freq: Every day | ORAL | Status: DC
  Administered 2021-12-07 (×2): 40 mg via ORAL
  Filled 2021-12-06 (×2): qty 1

## 2021-12-06 MED ORDER — MENTHOL 3 MG MT LOZG: 1.0000 | LOZENGE | OROMUCOSAL | Status: DC | PRN

## 2021-12-06 MED ORDER — ASPIRIN 325 MG PO TBEC
325.0000 mg | DELAYED_RELEASE_TABLET | Freq: Every day | ORAL | Status: DC
  Administered 2021-12-07: 325 mg via ORAL
  Filled 2021-12-06: qty 1

## 2021-12-06 MED ORDER — HYDROCODONE-ACETAMINOPHEN 7.5-325 MG PO TABS
1.0000 | ORAL_TABLET | ORAL | Status: DC | PRN
  Filled 2021-12-06: qty 2

## 2021-12-06 MED ORDER — PROPOFOL 10 MG/ML IV BOLUS
INTRAVENOUS | Status: AC
  Filled 2021-12-06: qty 20

## 2021-12-06 MED ORDER — MECLIZINE HCL 25 MG PO TABS: 25.0000 mg | ORAL_TABLET | Freq: Three times a day (TID) | ORAL | Status: DC | PRN

## 2021-12-06 MED ORDER — ONDANSETRON HCL 4 MG/2ML IJ SOLN: 4.0000 mg | Freq: Four times a day (QID) | INTRAMUSCULAR | Status: DC | PRN

## 2021-12-06 MED ORDER — PHENOL 1.4 % MT LIQD
1.0000 | OROMUCOSAL | Status: DC | PRN
  Filled 2021-12-06: qty 177

## 2021-12-06 MED ORDER — ALPRAZOLAM 0.5 MG PO TABS
1.0000 mg | ORAL_TABLET | Freq: Three times a day (TID) | ORAL | Status: DC | PRN
Start: 2021-12-06 — End: 2021-12-08
  Administered 2021-12-06: 1 mg via ORAL
  Filled 2021-12-06: qty 2

## 2021-12-06 MED ORDER — MIDAZOLAM HCL 2 MG/2ML IJ SOLN
1.0000 mg | Freq: Once | INTRAMUSCULAR | Status: AC
Start: 2021-12-06 — End: 2021-12-06
  Filled 2021-12-06: qty 1

## 2021-12-06 MED ORDER — CHLORHEXIDINE GLUCONATE 0.12 % MT SOLN: 15.0000 mL | Freq: Once | OROMUCOSAL | Status: AC

## 2021-12-06 MED ORDER — LACTATED RINGERS IV SOLN: INTRAVENOUS | Status: DC

## 2021-12-06 MED ORDER — EZETIMIBE 10 MG PO TABS
10.0000 mg | ORAL_TABLET | Freq: Every day | ORAL | Status: DC
  Administered 2021-12-07: 10 mg via ORAL
  Filled 2021-12-06: qty 1

## 2021-12-06 MED ORDER — ATORVASTATIN CALCIUM 40 MG PO TABS
40.0000 mg | ORAL_TABLET | Freq: Every day | ORAL | Status: DC
  Administered 2021-12-07: 40 mg via ORAL
  Filled 2021-12-06: qty 1

## 2021-12-06 MED ORDER — OXYCODONE HCL 5 MG/5ML PO SOLN: 5.0000 mg | Freq: Once | ORAL | Status: DC | PRN

## 2021-12-06 MED ORDER — BUPROPION HCL ER (XL) 150 MG PO TB24
300.0000 mg | ORAL_TABLET | Freq: Every day | ORAL | Status: DC
  Administered 2021-12-07: 300 mg via ORAL
  Filled 2021-12-06: qty 2

## 2021-12-06 MED ORDER — METOCLOPRAMIDE HCL 5 MG PO TABS: 5.0000 mg | ORAL_TABLET | Freq: Three times a day (TID) | ORAL | Status: DC | PRN

## 2021-12-06 MED ORDER — CHLORHEXIDINE GLUCONATE 0.12 % MT SOLN
OROMUCOSAL | Status: AC
  Administered 2021-12-06: 15 mL via OROMUCOSAL
  Filled 2021-12-06: qty 15

## 2021-12-06 MED ORDER — ACETAMINOPHEN 500 MG PO TABS
500.0000 mg | ORAL_TABLET | Freq: Four times a day (QID) | ORAL | Status: AC
  Administered 2021-12-06 – 2021-12-07 (×3): 500 mg via ORAL
  Filled 2021-12-06 (×3): qty 1

## 2021-12-06 MED ORDER — DOCUSATE SODIUM 100 MG PO CAPS
100.0000 mg | ORAL_CAPSULE | Freq: Two times a day (BID) | ORAL | Status: DC
  Administered 2021-12-06 – 2021-12-07 (×2): 100 mg via ORAL
  Filled 2021-12-06 (×2): qty 1

## 2021-12-06 MED ORDER — FENTANYL CITRATE (PF) 250 MCG/5ML IJ SOLN
INTRAMUSCULAR | Status: AC
  Filled 2021-12-06: qty 5

## 2021-12-06 MED ORDER — MORPHINE SULFATE (PF) 2 MG/ML IV SOLN
0.5000 mg | INTRAVENOUS | Status: DC | PRN
  Administered 2021-12-06 – 2021-12-07 (×2): 1 mg via INTRAVENOUS
  Filled 2021-12-06 (×2): qty 1

## 2021-12-06 MED ORDER — SUGAMMADEX SODIUM 200 MG/2ML IV SOLN
INTRAVENOUS | Status: DC | PRN
  Administered 2021-12-06: 200 mg via INTRAVENOUS

## 2021-12-06 MED ORDER — ONDANSETRON HCL 4 MG PO TABS: 4.0000 mg | ORAL_TABLET | Freq: Four times a day (QID) | ORAL | Status: DC | PRN

## 2021-12-06 MED ORDER — POTASSIUM CHLORIDE CRYS ER 10 MEQ PO TBCR
10.0000 meq | EXTENDED_RELEASE_TABLET | Freq: Every day | ORAL | Status: DC
  Administered 2021-12-07: 10 meq via ORAL
  Filled 2021-12-06 (×3): qty 1

## 2021-12-06 MED ORDER — VANCOMYCIN HCL 1000 MG IV SOLR
INTRAVENOUS | Status: AC
  Filled 2021-12-06: qty 20

## 2021-12-06 MED ORDER — CARBIDOPA-LEVODOPA 25-100 MG PO TABS
2.0000 | ORAL_TABLET | Freq: Two times a day (BID) | ORAL | Status: DC
  Administered 2021-12-06 – 2021-12-07 (×2): 2 via ORAL
  Filled 2021-12-06 (×2): qty 2

## 2021-12-06 MED ORDER — ACETAMINOPHEN 500 MG PO TABS: 1000.0000 mg | ORAL_TABLET | Freq: Once | ORAL | Status: DC

## 2021-12-06 MED ORDER — FENTANYL CITRATE (PF) 100 MCG/2ML IJ SOLN
INTRAMUSCULAR | Status: AC
  Filled 2021-12-06: qty 2

## 2021-12-06 MED ORDER — TRANEXAMIC ACID-NACL 1000-0.7 MG/100ML-% IV SOLN
1000.0000 mg | Freq: Once | INTRAVENOUS | Status: DC
  Filled 2021-12-06: qty 100

## 2021-12-06 MED ORDER — HYDROCODONE-ACETAMINOPHEN 5-325 MG PO TABS
1.0000 | ORAL_TABLET | ORAL | Status: DC | PRN
  Administered 2021-12-06 (×2): 2 via ORAL
  Filled 2021-12-06: qty 1
  Filled 2021-12-06: qty 2

## 2021-12-06 MED ORDER — MIDAZOLAM HCL 2 MG/2ML IJ SOLN
INTRAMUSCULAR | Status: AC
  Administered 2021-12-06: 1 mg via INTRAVENOUS
  Filled 2021-12-06: qty 2

## 2021-12-06 MED ORDER — IRBESARTAN 300 MG PO TABS
300.0000 mg | ORAL_TABLET | Freq: Every day | ORAL | Status: DC
  Administered 2021-12-07 (×2): 300 mg via ORAL
  Filled 2021-12-06 (×2): qty 1

## 2021-12-06 MED ORDER — FENTANYL CITRATE PF 50 MCG/ML IJ SOSY
50.0000 ug | PREFILLED_SYRINGE | Freq: Once | INTRAMUSCULAR | Status: AC
  Administered 2021-12-06: 50 ug via INTRAVENOUS
  Filled 2021-12-06: qty 1

## 2021-12-06 MED ORDER — METOCLOPRAMIDE HCL 5 MG/ML IJ SOLN: 5.0000 mg | Freq: Three times a day (TID) | INTRAMUSCULAR | Status: DC | PRN

## 2021-12-06 MED ORDER — CEFAZOLIN SODIUM-DEXTROSE 2-4 GM/100ML-% IV SOLN
INTRAVENOUS | Status: AC
  Filled 2021-12-06: qty 100

## 2021-12-06 MED ORDER — TRANEXAMIC ACID-NACL 1000-0.7 MG/100ML-% IV SOLN
INTRAVENOUS | Status: AC
Start: 2021-12-06 — End: ?
  Filled 2021-12-06: qty 100

## 2021-12-06 MED ORDER — FENTANYL CITRATE (PF) 100 MCG/2ML IJ SOLN
25.0000 ug | INTRAMUSCULAR | Status: DC | PRN
  Administered 2021-12-06 (×2): 50 ug via INTRAVENOUS

## 2021-12-06 MED ORDER — NITROGLYCERIN 0.4 MG SL SUBL: 0.4000 mg | SUBLINGUAL_TABLET | SUBLINGUAL | Status: DC | PRN

## 2021-12-06 MED ORDER — ALPRAZOLAM 0.5 MG PO TABS
0.5000 mg | ORAL_TABLET | Freq: Three times a day (TID) | ORAL | Status: DC | PRN
  Administered 2021-12-06: 0.5 mg via ORAL
  Filled 2021-12-06: qty 1

## 2021-12-06 MED ORDER — GABAPENTIN 800 MG PO TABS
800.0000 mg | ORAL_TABLET | Freq: Every day | ORAL | Status: DC
  Filled 2021-12-06: qty 1

## 2021-12-06 MED ORDER — OXYCODONE HCL 5 MG PO TABS: 5.0000 mg | ORAL_TABLET | Freq: Once | ORAL | Status: DC | PRN

## 2021-12-06 MED ORDER — 0.9 % SODIUM CHLORIDE (POUR BTL) OPTIME
TOPICAL | Status: DC | PRN
  Administered 2021-12-06 (×2): 1000 mL

## 2021-12-06 MED ORDER — DEXAMETHASONE SODIUM PHOSPHATE 10 MG/ML IJ SOLN
INTRAMUSCULAR | Status: DC | PRN
  Administered 2021-12-06: 5 mg via INTRAVENOUS

## 2021-12-06 MED ORDER — GABAPENTIN 800 MG PO TABS: 800.0000 mg | ORAL_TABLET | ORAL | Status: DC

## 2021-12-06 MED ORDER — ORAL CARE MOUTH RINSE: 15.0000 mL | Freq: Once | OROMUCOSAL | Status: AC

## 2021-12-06 MED ORDER — ONDANSETRON HCL 4 MG/2ML IJ SOLN: 4.0000 mg | Freq: Once | INTRAMUSCULAR | Status: DC | PRN

## 2021-12-06 MED ORDER — INSULIN ASPART 100 UNIT/ML IJ SOLN: 0.0000 [IU] | INTRAMUSCULAR | Status: DC | PRN

## 2021-12-06 MED ORDER — ACETAMINOPHEN 500 MG PO TABS
ORAL_TABLET | ORAL | Status: AC
  Filled 2021-12-06: qty 2

## 2021-12-06 MED ORDER — CEFAZOLIN SODIUM-DEXTROSE 1-4 GM/50ML-% IV SOLN
1.0000 g | Freq: Four times a day (QID) | INTRAVENOUS | Status: AC
  Administered 2021-12-06 – 2021-12-07 (×3): 1 g via INTRAVENOUS
  Filled 2021-12-06 (×6): qty 50

## 2021-12-06 MED ORDER — DABIGATRAN ETEXILATE MESYLATE 150 MG PO CAPS
150.0000 mg | ORAL_CAPSULE | Freq: Two times a day (BID) | ORAL | Status: DC
  Administered 2021-12-07: 150 mg via ORAL
  Filled 2021-12-06 (×2): qty 1

## 2021-12-06 MED ORDER — PRIMIDONE 50 MG PO TABS: 50.0000 mg | ORAL_TABLET | Freq: Every evening | ORAL | Status: DC | PRN

## 2021-12-06 MED ORDER — CEFAZOLIN SODIUM-DEXTROSE 2-4 GM/100ML-% IV SOLN: 2.0000 g | INTRAVENOUS | Status: DC

## 2021-12-06 MED ORDER — GABAPENTIN 400 MG PO CAPS
1600.0000 mg | ORAL_CAPSULE | Freq: Every day | ORAL | Status: DC
  Administered 2021-12-06: 1600 mg via ORAL
  Filled 2021-12-06: qty 4

## 2021-12-06 MED ORDER — HYDROCHLOROTHIAZIDE 25 MG PO TABS
25.0000 mg | ORAL_TABLET | Freq: Every day | ORAL | Status: DC
  Administered 2021-12-07: 25 mg via ORAL
  Filled 2021-12-06: qty 1

## 2021-12-06 MED ORDER — PHENYLEPHRINE HCL-NACL 20-0.9 MG/250ML-% IV SOLN
INTRAVENOUS | Status: DC | PRN
  Administered 2021-12-06: 40 ug/min via INTRAVENOUS

## 2021-12-06 MED ORDER — VANCOMYCIN HCL 1000 MG IV SOLR
INTRAVENOUS | Status: DC | PRN
  Administered 2021-12-06: 1000 mg via TOPICAL

## 2021-12-06 MED ORDER — ROCURONIUM BROMIDE 10 MG/ML (PF) SYRINGE
PREFILLED_SYRINGE | INTRAVENOUS | Status: DC | PRN
  Administered 2021-12-06: 70 mg via INTRAVENOUS

## 2021-12-06 MED ORDER — ACETAMINOPHEN 325 MG PO TABS: 325.0000 mg | ORAL_TABLET | Freq: Four times a day (QID) | ORAL | Status: DC | PRN

## 2021-12-06 MED ORDER — ONDANSETRON HCL 4 MG/2ML IJ SOLN
INTRAMUSCULAR | Status: DC | PRN
  Administered 2021-12-06: 4 mg via INTRAVENOUS

## 2021-12-06 MED ORDER — PROPOFOL 10 MG/ML IV BOLUS
INTRAVENOUS | Status: DC | PRN
  Administered 2021-12-06: 130 mg via INTRAVENOUS

## 2021-12-06 SURGICAL SUPPLY — 66 items
AID PSTN UNV HD RSTRNT DISP (MISCELLANEOUS) ×1
BAG COUNTER SPONGE SURGICOUNT (BAG) IMPLANT
BAG SPEC THK2 15X12 ZIP CLS (MISCELLANEOUS) ×1
BAG SPNG CNTER NS LX DISP (BAG)
BAG ZIPLOCK 12X15 (MISCELLANEOUS) ×2 IMPLANT
BIT DRILL FLUTED 3.0 STRL (BIT) IMPLANT
BLADE SAG 18X100X1.27 (BLADE) ×2 IMPLANT
BNDG ELASTIC 6X5.8 VLCR STR LF (GAUZE/BANDAGES/DRESSINGS) IMPLANT
BSPLAT GLND +2X24 MDLR (Joint) ×1 IMPLANT
CLSR STERI-STRIP ANTIMIC 1/2X4 (GAUZE/BANDAGES/DRESSINGS) IMPLANT
COVER SURGICAL LIGHT HANDLE (MISCELLANEOUS) ×2 IMPLANT
CUP SUT UNIV REVERS 36+2 RT (Cup) IMPLANT
DRAPE ORTHO SPLIT 77X108 STRL (DRAPES) ×2
DRAPE SHEET LG 3/4 BI-LAMINATE (DRAPES) ×2 IMPLANT
DRAPE SURG 17X11 SM STRL (DRAPES) ×2 IMPLANT
DRAPE SURG ORHT 6 SPLT 77X108 (DRAPES) ×4 IMPLANT
DRAPE TOP 10253 STERILE (DRAPES) ×2 IMPLANT
DRAPE U-SHAPE 47X51 STRL (DRAPES) ×2 IMPLANT
DRSG AQUACEL AG ADV 3.5X 6 (GAUZE/BANDAGES/DRESSINGS) IMPLANT
DRSG AQUACEL AG ADV 3.5X10 (GAUZE/BANDAGES/DRESSINGS) ×2 IMPLANT
DRSG PAD ABDOMINAL 8X10 ST (GAUZE/BANDAGES/DRESSINGS) IMPLANT
DURAPREP 26ML APPLICATOR (WOUND CARE) IMPLANT
ELECT REM PT RETURN 15FT ADLT (MISCELLANEOUS) ×2 IMPLANT
FACESHIELD WRAPAROUND (MASK) ×1 IMPLANT
FACESHIELD WRAPAROUND OR TEAM (MASK) ×2 IMPLANT
GAUZE SPONGE 4X4 12PLY STRL (GAUZE/BANDAGES/DRESSINGS) IMPLANT
GLENOID UNI REV MOD 24 +2 LAT (Joint) IMPLANT
GLENOSPHERE 39+4 LAT/24 UNI RV (Joint) IMPLANT
GLOVE BIO SURGEON STRL SZ7.5 (GLOVE) ×8 IMPLANT
GLOVE BIOGEL PI IND STRL 8 (GLOVE) ×4 IMPLANT
GLOVE BIOGEL PI INDICATOR 8 (GLOVE) ×2
GOWN STRL REUS W/ TWL XL LVL3 (GOWN DISPOSABLE) ×4 IMPLANT
GOWN STRL REUS W/TWL XL LVL3 (GOWN DISPOSABLE) ×2
IMMOBILIZER SHOULDER MED (ORTHOPEDIC SUPPLIES) IMPLANT
INSERT HUMERAL UNI REVERS 36 3 (Insert) IMPLANT
KIT BASIN OR (CUSTOM PROCEDURE TRAY) ×2 IMPLANT
KIT TURNOVER KIT A (KITS) IMPLANT
MANIFOLD NEPTUNE II (INSTRUMENTS) ×2 IMPLANT
NDL TAPERED W/ NITINOL LOOP (MISCELLANEOUS) IMPLANT
NEEDLE TAPERED W/ NITINOL LOOP (MISCELLANEOUS) IMPLANT
NS IRRIG 1000ML POUR BTL (IV SOLUTION) ×2 IMPLANT
PACK SHOULDER (CUSTOM PROCEDURE TRAY) ×2 IMPLANT
PIN NITINOL TARGETER 2.8 (PIN) IMPLANT
PROTECTOR NERVE ULNAR (MISCELLANEOUS) ×2 IMPLANT
RESTRAINT HEAD UNIVERSAL NS (MISCELLANEOUS) IMPLANT
SCREW CENTRAL MOD 30MM (Screw) IMPLANT
SCREW PERI LOCK 5.5X16 (Screw) IMPLANT
SCREW PERI LOCK 5.5X24 (Screw) IMPLANT
SCREW PERIPHERAL 5.5X20 LOCK (Screw) IMPLANT
SLING ARM IMMOBILIZER XL (CAST SUPPLIES) IMPLANT
SPONGE T-LAP 4X18 ~~LOC~~+RFID (SPONGE) IMPLANT
STEM HUM UNIV REV 9 (Stem) IMPLANT
SUCTION FRAZIER HANDLE 10FR (MISCELLANEOUS) ×1
SUCTION TUBE FRAZIER 10FR DISP (MISCELLANEOUS) ×2 IMPLANT
SUT FIBERWIRE #2 38 T-5 BLUE (SUTURE)
SUT MON AB 3-0 SH 27 (SUTURE) ×1
SUT MON AB 3-0 SH27 (SUTURE) ×2 IMPLANT
SUT VIC AB 0 CT1 36 (SUTURE) ×2 IMPLANT
SUT VIC AB 1 CT1 36 (SUTURE) ×2 IMPLANT
SUT VIC AB 2-0 CT1 27 (SUTURE) ×1
SUT VIC AB 2-0 CT1 TAPERPNT 27 (SUTURE) ×2 IMPLANT
SUTURE FIBERWR #2 38 T-5 BLUE (SUTURE) IMPLANT
SUTURE TAPE 1.3 40 TPR END (SUTURE) ×4 IMPLANT
SUTURETAPE 1.3 40 TPR END (SUTURE) ×2
TOWEL OR 17X26 10 PK STRL BLUE (TOWEL DISPOSABLE) ×2 IMPLANT
TUBE SUCTION HIGH CAP CLEAR NV (SUCTIONS) ×2 IMPLANT

## 2021-12-06 NOTE — Discharge Instructions (Addendum)
Orthopedic surgery discharge instructions:  -Maintain postoperative bandage until follow-up appointment.  This is waterproof, and you may begin showering on postoperative day #3.  Do not submerge underwater.  Maintain that bandage until your follow-up appointment in 2 weeks.  -No lifting over 2 pounds with operative arm.  You may use the arm immediately for activities of daily living such as bathing, washing your face and brushing your teeth, eating, and getting dressed.  Otherwise maintain your sling when you are out of the house and sleeping.  -Apply ice liberally to the shoulder throughout the day.  For mild to moderate pain use Tylenol and Advil as needed around-the-clock.  For breakthrough pain use oxycodone as necessary.  -You will return to see Dion Saucier PA-C in the office in 2 weeks  on 8/31 at 10:30am at [facility] for routine postoperative check with x-rays.

## 2021-12-06 NOTE — Transfer of Care (Signed)
Immediate Anesthesia Transfer of Care Note  Patient: [name]  Procedure(s) Performed: REVERSE SHOULDER ARTHROPLASTY (Right: Shoulder)  Patient Location: PACU  Anesthesia Type:GA combined with regional for post-op pain  Level of Consciousness: awake, alert  and oriented  Airway & Oxygen Therapy: Patient Spontanous Breathing and Patient connected to nasal cannula oxygen  Post-op Assessment: Report given to RN, Post -op Vital signs reviewed and stable and Patient moving all extremities  Post vital signs: Reviewed and stable  Last Vitals:  Vitals Value Taken Time  BP 117/77 12/06/21 1404  Temp    Pulse 49 12/06/21 1407  Resp 13 12/06/21 1407  SpO2 93 % 12/06/21 1407  Vitals shown include unvalidated device data.  Last Pain:  Vitals:   12/06/21 1015  TempSrc:   PainSc: 0-No pain         Complications: No notable events documented.

## 2021-12-06 NOTE — Op Note (Signed)
12/06/2021  8:17 PM  PATIENT:  [name].    PRE-OPERATIVE DIAGNOSIS:  Right shoulder massive rotator cuff tear  POST-OPERATIVE DIAGNOSIS:  Same  PROCEDURE:  1. Right REVERSE SHOULDER ARTHROPLASTY 2.  Right shoulder long head of biceps tenodesis transfer to pectoralis major tendon  SURGEON:  Yolonda Kida, MD  ASSISTANT: Dion Saucier, PA-C  Assistant attestation:  PA Mcclung was present for the entire procedure.  ANESTHESIA:   General  ESTIMATED BLOOD LOSS: 100 cc  PREOPERATIVE INDICATIONS:  [name]. is a  76 y.o. male with a diagnosis of Right shoulder massive rotator cuff tear who failed conservative measures and elected for surgical management.    The risks benefits and alternatives were discussed with the patient preoperatively including but not limited to the risks of infection, bleeding, nerve injury, cardiopulmonary complications, the need for revision surgery, dislocation, brachial plexus palsy, incomplete relief of pain, among others, and the patient was willing to proceed.  OPERATIVE IMPLANTS:  Arthrex reverse system with apex stem size 9 24 mm +2 lateralized baseplate with a 30 mm central screw and 4 peripheral locking screws Glenosphere size 39+4 lateralized offset Standard humeral tray with a +3 constrained polyethylene liner, please note that the 36 mm +3 constrained liner was implanted.  This had excellent stability and range of motion and thus we elected to leave this implanted after the polyethylene liner was identified.  OPERATIVE FINDINGS:  Massive and retracted rotator cuff tear of the entire supraspinatus and infraspinatus.  Teres minor was intact.  Subscapularis had upper border tearing.  The long head of the biceps tendon was flattened and significantly torn in the intra-articular portion consistent with longstanding rotator cuff arthropathy disease.  OPERATIVE PROCEDURE: The patient was brought to the operating room and placed in  the supine position. General anesthesia was administered. IV antibiotics were given. A Foley was not placed. Time out was performed. The upper extremity was prepped and draped in usual sterile fashion. The patient was in a beachchair position. Deltopectoral approach was carried out. The long head of biceps was identified in the intertubercular groove, it was transected at the level of the pectoralis major tendon and then utilizing a suture tape 2 figure-of-eight sutures were used to transfer the tendon to the pectoralis major.. The subscapularis was released off of the bone.   I then performed circumferential releases of the humerus, and then dislocated the head, and then reamed with the reamer to the above named size.  I then applied the jig, and cut the humeral head in 30 of retroversion, and then turned my attention to the glenoid.  Deep retractors were placed, and I resected the labrum, and then placed a guidepin into the center position on the glenoid, with slight inferior inclination. I then reamed over the guidepin, and this created a small metaphyseal cancellus blush inferiorly, removing just the cartilage to the subchondral bone superiorly. The base plate was selected and then I secured it centrally with a nonlocking screw, and I had excellent purchase both inferiorly and superiorly. I placed a short locking screws on anterior and posterior aspects.  I then turned my attention to the glenosphere, and impacted this into place, and then placed the set screw with good secure fit.  I sequentially broached, and then trialed, and was found to restore soft tissue tension, and it had 2 finger tightness. Therefore the above named components were selected. The shoulder felt stable throughout functional motion.  I then impacted the  real prosthesis into place, as well as the real humeral tray, and reduced the shoulder. The shoulder had excellent motion, and was stable, and I irrigated the wounds copiously.   We elected to use a +3 mm constrained liner due to increase stability but very limited trade off in range of motion.  We then utilized the suture cup for repair of the subscapularis.  I then irrigated the shoulder copiously once more, repaired the deltopectoral interval with #2 FiberWire followed by subcutaneous Vicryl, then monocryl for the skin,  with Steri-Strips and sterile gauze for the skin. The patient was awakened and returned back in stable and satisfactory condition. There no complications and they tolerated the procedure well.  All counts were correct x2.   Disposition:  He will be weightbearing to the operative extremity only for activities of daily living.  Otherwise no lifting over 2 pounds.  Active range of motion can begin with therapy but otherwise maintain sling for the first 2 weeks at all times.  I will see him back in the office in 2 weeks with x-rays.  We are can admit him overnight for pain control and close monitoring following his general anesthetic.

## 2021-12-06 NOTE — Brief Op Note (Signed)
12/06/2021  1:38 PM  PATIENT:  [name].  76 y.o. male  PRE-OPERATIVE DIAGNOSIS:  Right shoulder massive rotator cuff tear  POST-OPERATIVE DIAGNOSIS:  Right shoulder massive rotator cuff tear  PROCEDURE:  Procedure(s) with comments: REVERSE SHOULDER ARTHROPLASTY (Right) - 150  SURGEON:  Surgeon(s) and Role:    * Aundria Rud, Noah Delaine, MD - Primary  PHYSICIAN ASSISTANT: Dion Saucier, PA-C   ANESTHESIA:   regional and general  EBL:  100 cc  BLOOD ADMINISTERED:none  DRAINS: none   LOCAL MEDICATIONS USED:  NONE  SPECIMEN:  No Specimen  DISPOSITION OF SPECIMEN:  N/A  COUNTS:  YES  TOURNIQUET:  * No tourniquets in log *  DICTATION: .Note written in EPIC  PLAN OF CARE: Admit for overnight observation  PATIENT DISPOSITION:  PACU - hemodynamically stable.   Delay start of Pharmacological VTE agent (>24hrs) due to surgical blood loss or risk of bleeding: not applicable

## 2021-12-06 NOTE — H&P (Signed)
ORTHOPAEDIC H&P  REQUESTING PHYSICIAN: Yolonda Kida, MD  PCP:  Loyal Jacobson, MD  Chief Complaint: Right shoulder rotator cuff arthropathy  HPI: Christian Hannum. is a 76 y.o. male who complains of right shoulder pain and weakness following a known rotator cuff tear just about 8 or 9 months ago now.  Here today for definitive treatment with reverse arthroplasty given his age, size of tear, and degeneration of the glenohumeral joint.  No new complaints at this time.  Past Medical History:  Diagnosis Date   Acquired pes planus, left 06/02/2018   ACS (acute coronary syndrome) (HCC) 09/25/2019   Antiphospholipid antibody syndrome (HCC) 09/28/2016   Atopic dermatitis 09/09/2016   Benign essential tremor 07/04/2015   Cataract cortical, senile, bilateral 02/27/2016   Chest pain 07/11/2015   Chronic pain of right ankle 06/07/2016   Coronary artery disease involving native coronary artery 07/11/2015   Current use of long term anticoagulation 12/24/2013   DDD (degenerative disc disease), cervical 07/16/2013   Formatting of this note might be different from the original. STORY: MRI C spine revealed multilevel DDD without disc protrution or impingement. Severe right facet arthritis C 2-3, moderate severe right facet arthritis C7-T1.  Discussed with patient in legnth, he's not satisfied with the result of pain control. Will refer him to see Dr. Loletta Parish to consider facet block and will increase HCE to 10t   Diabetes mellitus without complication (HCC)    Diet controlled no meds   Diplopia 12/17/2016   DVT of leg (deep venous thrombosis) (HCC) 12/24/2013   Formatting of this note might be different from the original. Three times in left lower leg   Erectile dysfunction 09/09/2016   Essential hypertension 12/24/2013   GERD (gastroesophageal reflux disease) 12/24/2013   Formatting of this note might be different from the original. Barrett's Esophagus with dysplasia, prior ablations at  [facility] of this note might be different from the original. Overview:  Barrett's Esophagus with dysplasia, prior ablations at [facility]   History of Barrett's esophagus 07/11/2015   History of MI (myocardial infarction) 04/21/2017   Hypercoagulable state (HCC) 04/21/2017   Formatting of this note might be different from the original. History of anti-Cardiolite and antibody positive with DVT in 2002 following casting and 2008 following an MI by multiple mini strokes that occurred despite aspirin therapy for which she was switched to Coumadin with difficulty maintaining his INR and then complicated by his third DVT unprovoked   Hyperlipidemia, unspecified 12/24/2013   Impaired functional mobility, balance, gait, and endurance 11/10/2019   Insomnia 07/04/2015   Lumbar disc herniation with radiculopathy 10/17/2017   Major depressive disorder, recurrent, in remission (HCC) 07/04/2015   Mild cognitive impairment 07/11/2015   Myocardial infarction [facility])    Neck pain 07/16/2013   Formatting of this note might be different from the original. STORY: DDx:DDD, cervical spondylosis, facet arthropathy, radiculopathy.   Nontraumatic rupture of left posterior tibial tendon 01/18/2016   NSTEMI (non-ST elevated myocardial infarction) (HCC) 09/25/2019   Nuclear sclerotic cataract of both eyes 02/27/2016   Obesity (BMI 30-39.9) 07/04/2015   Pain of upper abdomen 07/11/2015   Parkinson disease (HCC) 04/03/2016   Right arm pain 06/03/2019   Sacroiliitis (HCC) 02/25/2018   Stroke (HCC) 11/02/2021   TIA   Tenosynovitis of right ankle 06/07/2016   Tibialis posterior tendinitis, left 06/02/2018   Past Surgical History:  Procedure Laterality Date   CORONARY ANGIOPLASTY WITH STENT PLACEMENT  2013   with stents  CORONARY STENT INTERVENTION N/A 09/27/2019   Procedure: CORONARY STENT INTERVENTION;  Surgeon: Swaziland, Peter M, MD;  Location: [facility] INVASIVE CV LAB;  Service: Cardiovascular;  Laterality: N/A;   FINGER  SURGERY Bilateral 2009   remove arthritis from index fingers   INTRAVASCULAR ULTRASOUND/IVUS N/A 09/27/2019   Procedure: Intravascular Ultrasound/IVUS;  Surgeon: Swaziland, Peter M, MD;  Location: [facility] INVASIVE CV LAB;  Service: Cardiovascular;  Laterality: N/A;   LEFT HEART CATH AND CORONARY ANGIOGRAPHY N/A 09/27/2019   Procedure: LEFT HEART CATH AND CORONARY ANGIOGRAPHY;  Surgeon: Swaziland, Peter M, MD;  Location: [facility] INVASIVE CV LAB;  Service: Cardiovascular;  Laterality: N/A;   LUMBAR LAMINECTOMY  2018   ULNAR NERVE TRANSPOSITION Left 2011   Social History   Socioeconomic History   Marital status: Married    Spouse name: Not on file   Number of children: Not on file   Years of education: Not on file   Highest education level: Not on file  Occupational History   Not on file  Tobacco Use   Smoking status: Never   Smokeless tobacco: Never  Vaping Use   Vaping Use: Never used  Substance and Sexual Activity   Alcohol use: Yes    Alcohol/week: 7.0 standard drinks of alcohol    Types: 7 Standard drinks or equivalent per week    Comment: daily   Drug use: No   Sexual activity: Yes  Other Topics Concern   Not on file  Social History Narrative   Not on file   Social Determinants of Health   Financial Resource Strain: Not on file  Food Insecurity: Not on file  Transportation Needs: Not on file  Physical Activity: Not on file  Stress: Not on file  Social Connections: Not on file   Family History  Problem Relation Age of Onset   CAD Father 11       Died of MI   CAD Brother 77       MI   Allergies  Allergen Reactions   Lisinopril Cough   Prior to Admission medications   Medication Sig Start Date End Date Taking? Authorizing Provider  acetaminophen (TYLENOL) 500 MG tablet Take 1,000 mg by mouth every 6 (six) hours as needed for moderate pain.   Yes [provider]  atorvastatin (LIPITOR) 40 MG tablet Take 40 mg by mouth daily.   Yes [provider]   buPROPion (WELLBUTRIN XL) 300 MG 24 hr tablet Take 300 mg by mouth daily.   Yes [provider]  carbidopa-levodopa (SINEMET IR) 25-100 MG tablet Take 2 tablets by mouth in the morning and at bedtime. As directed 11/05/19  Yes [provider]  gabapentin (NEURONTIN) 800 MG tablet Take 800-1,600 mg by mouth See admin instructions. Take 800 mg by mouth in the morning and 1600 mg at bedtime   Yes [provider]  HYDROcodone-acetaminophen (NORCO/VICODIN) 5-325 MG tablet Take 1 tablet by mouth every 6 (six) hours as needed for moderate pain. 09/01/19  Yes [provider]  irbesartan (AVAPRO) 300 MG tablet Take 1 tablet (300 mg total) by mouth daily. 09/28/19  Yes Furth, Cadence H, PA-C  pantoprazole (PROTONIX) 40 MG tablet Take 40 mg by mouth daily.   Yes [provider]  ALPRAZolam Prudy Feeler) 1 MG tablet Take 0.5 mg by mouth 3 (three) times daily as needed for anxiety.     [provider]  amLODipine (NORVASC) 5 MG tablet Take 1 tablet (5 mg total) by mouth daily. Patient taking  differently: Take 5 mg by mouth at bedtime. 10/06/19   Lendon Colonel, NP  aspirin EC 325 MG tablet Take 325 mg by mouth daily.    [provider]  atorvastatin (LIPITOR) 80 MG tablet Take 1 tablet (80 mg total) by mouth daily. Patient not taking: Reported on 11/20/2021 09/28/19   Furth, Cadence H, PA-C  clopidogrel (PLAVIX) 75 MG tablet Take 1 tablet (75 mg total) by mouth daily with breakfast. Patient not taking: Reported on 11/20/2021 02/15/20   Tobb, Kardie, DO  dabigatran (PRADAXA) 150 MG CAPS capsule Take 150 mg by mouth 2 (two) times daily.    [provider]  hydrochlorothiazide (HYDRODIURIL) 25 MG tablet TAKE 1 TABLET EVERY DAY (NEED APPOINTMENT FOR FURTHER REFILLS) 03/19/21   Tobb, Kardie, DO  magnesium oxide (MAG-OX) 400 (240 Mg) MG tablet Take 400 mg by mouth daily as needed (cramping).    [provider]  meclizine (ANTIVERT) 25 MG tablet  Take 25 mg by mouth 3 (three) times daily as needed for dizziness.    [provider]  nitroGLYCERIN (NITROSTAT) 0.4 MG SL tablet Place 1 tablet (0.4 mg total) under the tongue every 5 (five) minutes as needed for chest pain. 10/06/19 11/20/21  Lendon Colonel, NP  oxycodone (OXY-IR) 5 MG capsule Take 5 mg by mouth every 4 (four) hours as needed for pain.     [provider]  potassium chloride (KLOR-CON) 10 MEQ tablet Take 10 mEq by mouth daily. 10/10/21   [provider]   No results found.  Positive ROS: All other systems have been reviewed and were otherwise negative with the exception of those mentioned in the HPI and as above.  Physical Exam: General: Alert, no acute distress Cardiovascular: No pedal edema Respiratory: No cyanosis, no use of accessory musculature GI: No organomegaly, abdomen is soft and non-tender Skin: No lesions in the area of chief complaint Neurologic: Sensation intact distally Psychiatric: Patient is competent for consent with normal mood and affect Lymphatic: No axillary or cervical lymphadenopathy  MUSCULOSKELETAL: Right upper extremity is warm and well-perfused with no open wounds or lesions.  He is neurovascularly intact.  Assessment: Right shoulder rotator cuff arthropathy  Plan: -Plan to proceed today with reverse arthroplasty of the right shoulder.  We again discussed the risk and benefits of the procedure in detail including but not limited to bleeding, infection, damage to surrounding nerves and vessels, stiffness, failure of pain relief, dislocation, fracture, need for revision surgery, as well as the risk of anesthesia.  He is provided informed consent.  -We will plan for admission postoperatively for overnight observation and discharge home tomorrow.    Christian Stairs, MD Cell [phone]    12/06/2021 11:18 AM

## 2021-12-06 NOTE — Plan of Care (Signed)

## 2021-12-06 NOTE — Anesthesia Procedure Notes (Addendum)
Procedure Name: Intubation Date/Time: 12/06/2021 12:11 PM  Performed by: Amadeo Garnet, CRNAPre-anesthesia Checklist: Patient identified, Emergency Drugs available, Suction available, Patient being monitored and Timeout performed Patient Re-evaluated:Patient Re-evaluated prior to induction Oxygen Delivery Method: Circle system utilized Preoxygenation: Pre-oxygenation with 100% oxygen Induction Type: IV induction Ventilation: Mask ventilation without difficulty Laryngoscope Size: Mac and 4 Grade View: Grade III Tube type: Oral Tube size: 7.5 mm Number of attempts: 1 Airway Equipment and Method: Stylet Placement Confirmation: ETT inserted through vocal cords under direct vision, positive ETCO2 and breath sounds checked- equal and bilateral Secured at: 23 cm Tube secured with: Tape Dental Injury: Teeth and Oropharynx as per pre-operative assessment

## 2021-12-07 DIAGNOSIS — E871 Hypo-osmolality and hyponatremia: Secondary | ICD-10-CM | POA: Diagnosis not present

## 2021-12-07 DIAGNOSIS — M75101 Unspecified rotator cuff tear or rupture of right shoulder, not specified as traumatic: Secondary | ICD-10-CM | POA: Diagnosis not present

## 2021-12-07 LAB — BASIC METABOLIC PANEL
Anion gap: 10 (ref 5–15)
Anion gap: 8 (ref 5–15)
BUN: 29 mg/dL — ABNORMAL HIGH (ref 8–23)
BUN: 27 mg/dL — ABNORMAL HIGH (ref 8–23)
CO2: 19 mmol/L — ABNORMAL LOW (ref 22–32)
CO2: 27 mmol/L (ref 22–32)
Calcium: 8.4 mg/dL — ABNORMAL LOW (ref 8.9–10.3)
Calcium: 8.7 mg/dL — ABNORMAL LOW (ref 8.9–10.3)
Chloride: 99 mmol/L (ref 98–111)
Chloride: 100 mmol/L (ref 98–111)
Creatinine, Ser: 1.1 mg/dL (ref 0.61–1.24)
Creatinine, Ser: 1.24 mg/dL (ref 0.61–1.24)
GFR, Estimated: >60 mL/min (ref >60)
GFR, Estimated: >60 mL/min (ref >60)
Glucose, Bld: 234 mg/dL — ABNORMAL HIGH (ref 70–99)
Glucose, Bld: 142 mg/dL — ABNORMAL HIGH (ref 70–99)
Potassium: 5.2 mmol/L — ABNORMAL HIGH (ref 3.5–5.1)
Potassium: 4.1 mmol/L (ref 3.5–5.1)
Sodium: 128 mmol/L — ABNORMAL LOW (ref 135–145)
Sodium: 135 mmol/L (ref 135–145)

## 2021-12-07 LAB — HEMOGLOBIN AND HEMATOCRIT, BLOOD
HCT: 41.6 % (ref 39.0–52.0)
Hemoglobin: 14.5 g/dL (ref 13.0–17.0)

## 2021-12-07 LAB — TROPONIN I (HIGH SENSITIVITY): Troponin I (High Sensitivity): 9 ng/L (ref <18)

## 2021-12-07 MED ORDER — METHOCARBAMOL 500 MG PO TABS: 500.0000 mg | ORAL_TABLET | Freq: Three times a day (TID) | ORAL | 0 refills | Status: AC | PRN

## 2021-12-07 MED ORDER — BUPIVACAINE HCL (PF) 0.5 % IJ SOLN
INTRAMUSCULAR | Status: DC | PRN
  Administered 2021-12-06: 15 mL

## 2021-12-07 MED ORDER — OXYCODONE HCL 5 MG PO TABS: 5.0000 mg | ORAL_TABLET | Freq: Four times a day (QID) | ORAL | 0 refills | Status: AC | PRN

## 2021-12-07 MED ORDER — BUPIVACAINE LIPOSOME 1.3 % IJ SUSP
INTRAMUSCULAR | Status: DC | PRN
  Administered 2021-12-06: 10 mL

## 2021-12-07 MED ORDER — GABAPENTIN 400 MG PO CAPS
800.0000 mg | ORAL_CAPSULE | Freq: Every day | ORAL | Status: DC
  Administered 2021-12-07: 800 mg via ORAL
  Filled 2021-12-07: qty 2

## 2021-12-07 MED ORDER — ONDANSETRON HCL 4 MG PO TABS: 4.0000 mg | ORAL_TABLET | Freq: Three times a day (TID) | ORAL | 0 refills | Status: AC | PRN

## 2021-12-07 NOTE — Addendum Note (Signed)
Addendum  created 12/07/21 1331 by Lucretia Kern, MD   Child order released for a procedure order, Clinical Note Signed, Intraprocedure Blocks edited, Intraprocedure Meds edited, SmartForm saved

## 2021-12-07 NOTE — Progress Notes (Signed)
Pt potassium 5.2. Paged [facility]. No new orders. Charma Igo, LPN [id number]/50/3888

## 2021-12-07 NOTE — Evaluation (Signed)
Occupational Therapy Evaluation Patient Details Name: [name]. MRN: [id number] DOB: 01-27-1946 Today's Date: 12/07/2021   History of Present Illness 76 yo male s/p 8/17 R reverse shoulder arthroplasty PMH ACS, chronic R ankle pain, DDD, DM, diplopia major depressive disorder, MI, neck pain, obesity, parkinson disease, TIA 2023 Back surg 2018   Clinical Impression   Patient is s/p R reverse Shoulder arthoplasty surgery resulting in functional limitations due to the deficits listed below (see OT problem list). Pt  Patient will benefit from skilled OT acutely to increase independence and safety with ADLS to allow discharge home with wife..       Recommendations for follow up therapy are one component of a multi-disciplinary discharge planning process, led by the attending physician.  Recommendations may be updated based on patient status, additional functional criteria and insurance authorization.   Follow Up Recommendations  Follow physician's recommendations for discharge plan and follow up therapies    Assistance Recommended at Discharge Set up Supervision/Assistance  Patient can return home with the following A little help with walking and/or transfers;A little help with bathing/dressing/bathroom;Assistance with cooking/housework;Assistance with feeding;Direct supervision/assist for medications management;Assist for transportation    Functional Status Assessment  Patient has had a recent decline in their functional status and demonstrates the ability to make significant improvements in function in a reasonable and predictable amount of time.  Equipment Recommendations  None recommended by OT    Recommendations for Other Services       Precautions / Restrictions Precautions Precautions: Shoulder Shoulder Interventions: Shoulder sling/immobilizer;Off for dressing/bathing/exercises Precaution Comments: shoulder protocol provided and reviewed for alds. Required Braces or  Orthoses: Sling Restrictions Weight Bearing Restrictions: Yes RUE Weight Bearing: Non weight bearing      Mobility Bed Mobility               General bed mobility comments: oob in chair on arrival    Transfers Overall transfer level: Needs assistance   Transfers: Sit to/from Stand Sit to Stand: Min assist           General transfer comment: pt able to push up with L UE. pt with R UE blocked by therapist to prevent abduction of R UE      Balance Overall balance assessment: Mild deficits observed, not formally tested                                         ADL either performed or assessed with clinical judgement   ADL Overall ADL's : Needs assistance/impaired Eating/Feeding: Minimal assistance;Sitting   Grooming: Minimal assistance;Sitting   Upper Body Bathing: Minimal assistance;Sitting   Lower Body Bathing: Moderate assistance   Upper Body Dressing : Minimal assistance   Lower Body Dressing: Moderate assistance   Toilet Transfer: Min guard;Ambulation;Comfort height toilet   Toileting- Clothing Manipulation and Hygiene: Moderate assistance       Functional mobility during ADLs: Min guard General ADL Comments: pt walking with decreased gait velocity and widen base of support. pt reports wife (A) upon d/c home     Vision Baseline Vision/History: 1 Wears glasses Ability to See in Adequate Light: 0 Adequate       Perception     Praxis      Pertinent Vitals/Pain Pain Assessment Pain Assessment: Faces Faces Pain Scale: Hurts little more Pain Location: R shoulder Pain Descriptors / Indicators: Discomfort, Operative site guarding, Sore Pain Intervention(s):  Monitored during session, Premedicated before session, Repositioned, Limited activity within patient's tolerance     Hand Dominance Right   Extremity/Trunk Assessment Upper Extremity Assessment Upper Extremity Assessment: RUE deficits/detail RUE Deficits / Details: s/p  surg   Lower Extremity Assessment Lower Extremity Assessment: Generalized weakness (reports thigh hurting with ambulation due to weakness)   Cervical / Trunk Assessment Cervical / Trunk Assessment: Kyphotic   Communication Communication Communication: No difficulties   Cognition Arousal/Alertness: Awake/alert Behavior During Therapy: WFL for tasks assessed/performed Overall Cognitive Status: Impaired/Different from baseline Area of Impairment: Memory                     Memory: Decreased recall of precautions         General Comments: pt needed cues throughout session to avoid abduction of R UE and decrease attempts to use R UE. pt is very R dominant and will benefit from R UE sling use initially to help reinforce precautions. Pt asking the same questions several times for clarify R UE restrictions. Pt asking two times during session if he can lay on his R shoulder to sleep. Pt again advised recliner at home could be a better option if he can not position in the bed on back or L side     General Comments  dressing dry and intact. phone taken on patient phone as a reference tool, ice applied to shoulder and sling adjusted for proper fit    Exercises Exercises: Shoulder Shoulder Exercises Shoulder Flexion: PROM, Right, 10 reps, Supine Elbow Flexion: AAROM, Right, 15 reps, Seated Elbow Extension: AAROM, Right, 15 reps, Seated Wrist Flexion: AAROM, Right, 15 reps, Seated Wrist Extension: AAROM, Right, 15 reps, Seated   Shoulder Instructions Shoulder Instructions Donning/doffing shirt without moving shoulder: Minimal assistance Method for sponge bathing under operated UE: Minimal assistance Donning/doffing sling/immobilizer: Minimal assistance Correct positioning of sling/immobilizer: Minimal assistance ROM for elbow, wrist and digits of operated UE: Minimal assistance Sling wearing schedule (on at all times/off for ADL's): Minimal assistance Proper positioning of  operated UE when showering: Minimal assistance Positioning of UE while sleeping: Minimal assistance    Home Living Family/patient expects to be discharged to:: Private residence Living Arrangements: Spouse/significant other Available Help at Discharge: Family;Available 24 hours/day Type of Home: House Home Access: Stairs to enter     Home Layout: One level     Bathroom Shower/Tub: Producer, television/film/video: Standard     Home Equipment: Hand held shower head;Grab bars - tub/shower;Shower seat Nurse, children's)   Additional Comments: reports wife is prsent in the home with previous back surg herself      Prior Functioning/Environment Prior Level of Function : Needs assist               ADLs Comments: reports having wife (A) for the past 9 months        OT Problem List: Decreased strength;Decreased activity tolerance;Impaired balance (sitting and/or standing);Decreased cognition;Obesity;Impaired UE functional use      OT Treatment/Interventions: Self-care/ADL training;Therapeutic exercise;DME and/or AE instruction;Modalities;Therapeutic activities;Cognitive remediation/compensation;Patient/family education;Balance training    OT Goals(Current goals can be found in the care plan section) Acute Rehab OT Goals Patient Stated Goal: to be able to use R UE again OT Goal Formulation: With patient Time For Goal Achievement: 12/21/21 Potential to Achieve Goals: Good  OT Frequency: Min 2X/week    Co-evaluation              AM-PAC OT "6 Clicks" Daily Activity  Outcome Measure Help from another person eating meals?: A Little Help from another person taking care of personal grooming?: A Little Help from another person toileting, which includes using toliet, bedpan, or urinal?: A Little Help from another person bathing (including washing, rinsing, drying)?: A Lot Help from another person to put on and taking off regular upper body clothing?: A Little Help from  another person to put on and taking off regular lower body clothing?: A Lot 6 Click Score: 16   End of Session Equipment Utilized During Treatment: Gait belt Nurse Communication: Mobility status;Precautions;Weight bearing status  Activity Tolerance: Patient tolerated treatment well Patient left: in chair;with call bell/phone within reach  OT Visit Diagnosis: Unsteadiness on feet (R26.81);Muscle weakness (generalized) (M62.81)                Time: 4562-5638 OT Time Calculation (min): 37 min Charges:  OT General Charges $OT Visit: 1 Visit OT Evaluation $OT Eval Moderate Complexity: 1 Mod OT Treatments $Self Care/Home Management : 8-22 mins   Brynn, OTR/L  Acute Rehabilitation Services Office: [phone] .   Mateo Flow 12/07/2021, 1:18 PM

## 2021-12-07 NOTE — Progress Notes (Signed)
   Subjective:  Christian Catala. is a 76 y.o. male, 1 Day Post-Op    s/p Procedure(s): REVERSE SHOULDER ARTHROPLASTY   Patient reports pain as mild.  Reports block still partially active unable to actively move the elbow but able to make a fist.  Improving sensation.  Has been able to urinate, is passing gas.  Denies chest pain, shortness of breath, nausea or vomiting.  Objective:   VITALS:   Vitals:   12/07/21 0324 12/07/21 0917 12/07/21 1355 12/07/21 1359  BP: 103/75 95/72 96/67  94/65  Pulse: (!) 55 61 64 (!) 58  Resp: 16 18 18    Temp: 97.6 F (36.4 C) 98.6 F (37 C) 97.9 F (36.6 C)   TempSrc: Oral     SpO2: 96% 100% 94% 93%  Weight:      Height:         Right Upper Extremity:   INSPECTION & PALPATION: Aquacell present with no drainage. C/D/I    SENSORY: sensation is intact to light touch in:  superficial radial nerve distribution (dorsal first web space) median nerve distribution (tip of index finger)   ulnar nerve distribution (tip of small finger)       ROM: painless passive range of motion of the elbow and wrist, active fist   MOTOR:  + motor posterior interosseous nerve (thumb IP extension) + anterior interosseous nerve (thumb IP flexion, index finger DIP flexion) + radial nerve (wrist extension) + median nerve (palpable firing thenar mass) + ulnar nerve (palpable firing of first dorsal interosseous muscle)    VASCULAR: 2+ radial pulse, brisk capillary refill < 2 sec, fingers warm and well-perfused    Lab Results  Component Value Date   WBC 7.3 11/23/2021   HGB 14.5 12/07/2021   HCT 41.6 12/07/2021   MCV 95.9 11/23/2021   PLT 226 11/23/2021   BMET    Component Value Date/Time   NA 128 (L) 12/07/2021 0132   K 5.2 (H) 12/07/2021 0132   CL 99 12/07/2021 0132   CO2 19 (L) 12/07/2021 0132   GLUCOSE 234 (H) 12/07/2021 0132   BUN 29 (H) 12/07/2021 0132   CREATININE 1.10 12/07/2021 0132   CALCIUM 8.4 (L) 12/07/2021 0132   GFRNONAA >60  12/07/2021 0132     Assessment/Plan: 1 Day Post-Op   Principal Problem:   S/P reverse total shoulder arthroplasty, right   Dispo: Patient with multiple abnormal labs, Dr. 12/09/2021 Recommends medicine consult. Consult placed. We will have them evaluate and determine medical readiness for discharge, ready from orthopedic perspective for discharge.      Advance diet Up with therapy   Weightbearing Status: NWB RUE  DVT Prophylaxis: pradaxa    Aundria Rud 12/07/2021, 4:08 PM  12/09/2021 PA-C  Physician Assistant with Dr. Dion Saucier [place]

## 2021-12-07 NOTE — Consult Note (Signed)
Initial Consultation Note   Patient: [name]. ZOX:[id number] DOB: 07/15/1945 PCP: Loyal Jacobson, MD DOA: 12/06/2021 DOS: the patient was seen and examined on 12/07/2021 Primary service: Yolonda Kida, MD  Referring physician: Yolonda Kida, MD  Reason for consult: Abnormal labs  Assessment/Plan:  Abnormal labs Appears to be either error or transient as labs have normalized. No further recommendations at this time. Resume home medications as previously prescribed.  Chest pain Non-reproducible. Atypical. Troponin of 9. Unlikely cardiac related. Outpatient PCP follow-up.  TR[facility] will sign off at present, please call us again when needed.  HPI: Christian Bob. is a 76 y.o. male with past medical history listed below. Patient presented for reverse arthroplasty of right shoulder. During hospitalization, BMP labs significant for sodium of 128, potassium of 5.2, CO2 of 19. Patient reports some mid-sternal, non-radiating, non-exertional chest pain associated with sore throat. No dyspnea, diaphoresis, abdominal pain, diarrhea, constipation.  Review of Systems: As mentioned in the history of present illness. All other systems reviewed and are negative. Past Medical History:  Diagnosis Date   Acquired pes planus, left 06/02/2018   ACS (acute coronary syndrome) (HCC) 09/25/2019   Antiphospholipid antibody syndrome (HCC) 09/28/2016   Atopic dermatitis 09/09/2016   Benign essential tremor 07/04/2015   Cataract cortical, senile, bilateral 02/27/2016   Chest pain 07/11/2015   Chronic pain of right ankle 06/07/2016   Coronary artery disease involving native coronary artery 07/11/2015   Current use of long term anticoagulation 12/24/2013   DDD (degenerative disc disease), cervical 07/16/2013   Formatting of this note might be different from the original. STORY: MRI C spine revealed multilevel DDD without disc protrution or impingement. Severe right facet arthritis C 2-3,  moderate severe right facet arthritis C7-T1.  Discussed with patient in legnth, he's not satisfied with the result of pain control. Will refer him to see Dr. Loletta Parish to consider facet block and will increase HCE to 10t   Diabetes mellitus without complication (HCC)    Diet controlled no meds   Diplopia 12/17/2016   DVT of leg (deep venous thrombosis) (HCC) 12/24/2013   Formatting of this note might be different from the original. Three times in left lower leg   Erectile dysfunction 09/09/2016   Essential hypertension 12/24/2013   GERD (gastroesophageal reflux disease) 12/24/2013   Formatting of this note might be different from the original. Barrett's Esophagus with dysplasia, prior ablations at [facility] of this note might be different from the original. Overview:  Barrett's Esophagus with dysplasia, prior ablations at [facility]   History of Barrett's esophagus 07/11/2015   History of MI (myocardial infarction) 04/21/2017   Hypercoagulable state (HCC) 04/21/2017   Formatting of this note might be different from the original. History of anti-Cardiolite and antibody positive with DVT in 2002 following casting and 2008 following an MI by multiple mini strokes that occurred despite aspirin therapy for which she was switched to Coumadin with difficulty maintaining his INR and then complicated by his third DVT unprovoked   Hyperlipidemia, unspecified 12/24/2013   Impaired functional mobility, balance, gait, and endurance 11/10/2019   Insomnia 07/04/2015   Lumbar disc herniation with radiculopathy 10/17/2017   Major depressive disorder, recurrent, in remission (HCC) 07/04/2015   Mild cognitive impairment 07/11/2015   Myocardial infarction [facility])    Neck pain 07/16/2013   Formatting of this note might be different from the original. STORY: DDx:DDD, cervical spondylosis, facet arthropathy, radiculopathy.   Nontraumatic rupture of left posterior tibial tendon 01/18/2016  NSTEMI (non-ST elevated  myocardial infarction) (HCC) 09/25/2019   Nuclear sclerotic cataract of both eyes 02/27/2016   Obesity (BMI 30-39.9) 07/04/2015   Pain of upper abdomen 07/11/2015   Parkinson disease (HCC) 04/03/2016   Right arm pain 06/03/2019   Sacroiliitis (HCC) 02/25/2018   Stroke (HCC) 11/02/2021   TIA   Tenosynovitis of right ankle 06/07/2016   Tibialis posterior tendinitis, left 06/02/2018   Past Surgical History:  Procedure Laterality Date   CORONARY ANGIOPLASTY WITH STENT PLACEMENT  2013   with stents   CORONARY STENT INTERVENTION N/A 09/27/2019   Procedure: CORONARY STENT INTERVENTION;  Surgeon: Swaziland, Peter M, MD;  Location: [facility] INVASIVE CV LAB;  Service: Cardiovascular;  Laterality: N/A;   FINGER SURGERY Bilateral 2009   remove arthritis from index fingers   INTRAVASCULAR ULTRASOUND/IVUS N/A 09/27/2019   Procedure: Intravascular Ultrasound/IVUS;  Surgeon: Swaziland, Peter M, MD;  Location: [facility] INVASIVE CV LAB;  Service: Cardiovascular;  Laterality: N/A;   LEFT HEART CATH AND CORONARY ANGIOGRAPHY N/A 09/27/2019   Procedure: LEFT HEART CATH AND CORONARY ANGIOGRAPHY;  Surgeon: Swaziland, Peter M, MD;  Location: [facility] INVASIVE CV LAB;  Service: Cardiovascular;  Laterality: N/A;   LUMBAR LAMINECTOMY  2018   ULNAR NERVE TRANSPOSITION Left 2011   Social History:  reports that he has never smoked. He has never used smokeless tobacco. He reports current alcohol use of about 7.0 standard drinks of alcohol per week. He reports that he does not use drugs.  Allergies  Allergen Reactions   Lisinopril Cough    Family History  Problem Relation Age of Onset   CAD Father 48       Died of MI   CAD Brother 45       MI    Prior to Admission medications   Medication Sig Start Date End Date Taking? Authorizing Provider  acetaminophen (TYLENOL) 500 MG tablet Take 1,000 mg by mouth every 6 (six) hours as needed for moderate pain.   Yes [provider]  atorvastatin (LIPITOR) 40 MG tablet Take 40 mg by  mouth daily.   Yes [provider]  buPROPion (WELLBUTRIN XL) 300 MG 24 hr tablet Take 300 mg by mouth daily.   Yes [provider]  carbidopa-levodopa (SINEMET IR) 25-100 MG tablet Take 2 tablets by mouth in the morning and at bedtime. As directed 11/05/19  Yes [provider]  ezetimibe (ZETIA) 10 MG tablet Take 10 mg by mouth daily.   Yes [provider]  gabapentin (NEURONTIN) 800 MG tablet Take 800-1,600 mg by mouth See admin instructions. Take 800 mg by mouth in the morning and 1600 mg at bedtime   Yes [provider]  HYDROcodone-acetaminophen (NORCO/VICODIN) 5-325 MG tablet Take 1 tablet by mouth every 6 (six) hours as needed for moderate pain. 09/01/19  Yes [provider]  irbesartan (AVAPRO) 300 MG tablet Take 1 tablet (300 mg total) by mouth daily. 09/28/19  Yes Furth, Cadence H, PA-C  pantoprazole (PROTONIX) 40 MG tablet Take 40 mg by mouth daily.   Yes [provider]  primidone (MYSOLINE) 50 MG tablet Take 50 mg by mouth at bedtime as needed (For sleep).   Yes [provider]  Vitamin D, Ergocalciferol, (DRISDOL) 1.25 MG (50000 UNIT) CAPS capsule Take 50,000 Units by mouth every 7 (seven) days.   Yes [provider]  ALPRAZolam Prudy Feeler) 1 MG tablet Take 0.5 mg by mouth 3 (three) times daily as needed for anxiety.     [provider]  amLODipine (NORVASC) 5 MG tablet Take 1 tablet (5 mg total) by mouth daily. Patient taking differently: Take 5 mg by mouth at bedtime. 10/06/19   Jodelle Gross, NP  aspirin EC 325 MG tablet Take 325 mg by mouth daily.    [provider]  atorvastatin (LIPITOR) 80 MG tablet Take 1 tablet (80 mg total) by mouth daily. Patient not taking: Reported on 11/20/2021 09/28/19   Furth, Cadence H, PA-C  clopidogrel (PLAVIX) 75 MG tablet Take 1 tablet (75 mg total) by mouth daily with breakfast. Patient not taking: Reported on 11/20/2021 02/15/20   Tobb, Kardie, DO   dabigatran (PRADAXA) 150 MG CAPS capsule Take 150 mg by mouth 2 (two) times daily.    [provider]  hydrochlorothiazide (HYDRODIURIL) 25 MG tablet TAKE 1 TABLET EVERY DAY (NEED APPOINTMENT FOR FURTHER REFILLS) 03/19/21   Tobb, Kardie, DO  magnesium oxide (MAG-OX) 400 (240 Mg) MG tablet Take 400 mg by mouth daily as needed (cramping).    [provider]  meclizine (ANTIVERT) 25 MG tablet Take 25 mg by mouth 3 (three) times daily as needed for dizziness.    [provider]  nitroGLYCERIN (NITROSTAT) 0.4 MG SL tablet Place 1 tablet (0.4 mg total) under the tongue every 5 (five) minutes as needed for chest pain. 10/06/19 11/20/21  Jodelle Gross, NP  oxycodone (OXY-IR) 5 MG capsule Take 5 mg by mouth every 4 (four) hours as needed for pain.     [provider]  potassium chloride (KLOR-CON) 10 MEQ tablet Take 10 mEq by mouth daily. 10/10/21   [provider]    Physical Exam: Vitals:   12/07/21 0324 12/07/21 0917 12/07/21 1355 12/07/21 1359  BP: 103/75 95/72 96/67  94/65  Pulse: (!) 55 61 64 (!) 58  Resp: 16 18 18    Temp: 97.6 F (36.4 C) 98.6 F (37 C) 97.9 F (36.6 C)   TempSrc: Oral     SpO2: 96% 100% 94% 93%  Weight:      Height:       General exam: Appears calm and comfortable and in no acute distress. Conversant Respiratory: Clear to auscultation. Respiratory effort normal with no intercostal retractions or use of accessory muscles Cardiovascular: S1 & S2 heard, RRR. No murmurs, rubs, gallops or clicks. 2+ b/l LE edema Gastrointestinal: Abdomen is non-distended, soft and non-tender. No masses felt. Normal bowel sounds heard Neurologic: No focal neurological deficits Musculoskeletal: No calf tenderness Skin: No cyanosis. No new rashes Psychiatry: Alert and oriented. Memory intact. Mood & affect appropriate   Data Reviewed:  Repeat BMP significant for sodium of 125, potassium of 4.1, CO2 of 27    Family Communication: Wife at  bedside Primary team communication: Thank you very much for involving Dion Saucier in the care of your patient.  Author: [place], MD 12/07/2021 4:25 PM  For on call review [email or web address].

## 2021-12-07 NOTE — Progress Notes (Signed)
OT NOTE  OT evaluation and education completed. Formal OT detailed note to follow. Pt with nerve block wearing off with hand movement, some elbow activation but unable to complete supination/ pronation. Pt dressed with wife pending arrival. Full packet of all education provided to the patient at this time.    Timmothy Euler, OTR/L  Acute Rehabilitation Services Office: [phone] .

## 2021-12-07 NOTE — Anesthesia Procedure Notes (Signed)
  Anesthesia Regional Block: Interscalene brachial plexus block   Pre-Anesthetic Checklist: , timeout performed,  Correct Patient, Correct Site, Correct Laterality,  Correct Procedure, Correct Position, site marked,  Risks and benefits discussed,  Surgical consent,  Pre-op evaluation,  At surgeon's request and post-op pain management  Laterality: Right  Prep: chloraprep       Needles:  Injection technique: Single-shot  Needle Type: Echogenic Stimulator Needle     Needle Length: 10cm  Needle Gauge: 20     Additional Needles:   Procedures:,,,, ultrasound used (permanent image in chart),,    Narrative:  Start time: 12/06/2021 11:30 AM End time: 12/06/2021 11:34 AM Injection made incrementally with aspirations every 5 mL.  Performed by: Personally  Anesthesiologist: Lucretia Kern, MD  Additional Notes: Standard monitors applied. Skin prepped. Good needle visualization with ultrasound. Injection made in 5cc increments with no resistance to injection. Patient tolerated the procedure well.

## 2021-12-07 NOTE — Discharge Summary (Signed)
Patient ID: [name]. MRN: [id number] DOB/AGE: Apr 14, 1946 76 y.o.  Admit date: 12/06/2021 Discharge date: 12/07/21  Primary Diagnosis: Right shoulder rotator cuff arthropathy Admission Diagnoses: Status post right reverse total shoulder arthroplasty Past Medical History:  Diagnosis Date   Acquired pes planus, left 06/02/2018   ACS (acute coronary syndrome) (HCC) 09/25/2019   Antiphospholipid antibody syndrome (HCC) 09/28/2016   Atopic dermatitis 09/09/2016   Benign essential tremor 07/04/2015   Cataract cortical, senile, bilateral 02/27/2016   Chest pain 07/11/2015   Chronic pain of right ankle 06/07/2016   Coronary artery disease involving native coronary artery 07/11/2015   Current use of long term anticoagulation 12/24/2013   DDD (degenerative disc disease), cervical 07/16/2013   Formatting of this note might be different from the original. STORY: MRI C spine revealed multilevel DDD without disc protrution or impingement. Severe right facet arthritis C 2-3, moderate severe right facet arthritis C7-T1.  Discussed with patient in legnth, he's not satisfied with the result of pain control. Will refer him to see Dr. Loletta Parish to consider facet block and will increase HCE to 10t   Diabetes mellitus without complication (HCC)    Diet controlled no meds   Diplopia 12/17/2016   DVT of leg (deep venous thrombosis) (HCC) 12/24/2013   Formatting of this note might be different from the original. Three times in left lower leg   Erectile dysfunction 09/09/2016   Essential hypertension 12/24/2013   GERD (gastroesophageal reflux disease) 12/24/2013   Formatting of this note might be different from the original. Barrett's Esophagus with dysplasia, prior ablations at [facility] of this note might be different from the original. Overview:  Barrett's Esophagus with dysplasia, prior ablations at [facility]   History of Barrett's esophagus 07/11/2015   History of MI (myocardial infarction)  04/21/2017   Hypercoagulable state (HCC) 04/21/2017   Formatting of this note might be different from the original. History of anti-Cardiolite and antibody positive with DVT in 2002 following casting and 2008 following an MI by multiple mini strokes that occurred despite aspirin therapy for which she was switched to Coumadin with difficulty maintaining his INR and then complicated by his third DVT unprovoked   Hyperlipidemia, unspecified 12/24/2013   Impaired functional mobility, balance, gait, and endurance 11/10/2019   Insomnia 07/04/2015   Lumbar disc herniation with radiculopathy 10/17/2017   Major depressive disorder, recurrent, in remission (HCC) 07/04/2015   Mild cognitive impairment 07/11/2015   Myocardial infarction [facility])    Neck pain 07/16/2013   Formatting of this note might be different from the original. STORY: DDx:DDD, cervical spondylosis, facet arthropathy, radiculopathy.   Nontraumatic rupture of left posterior tibial tendon 01/18/2016   NSTEMI (non-ST elevated myocardial infarction) (HCC) 09/25/2019   Nuclear sclerotic cataract of both eyes 02/27/2016   Obesity (BMI 30-39.9) 07/04/2015   Pain of upper abdomen 07/11/2015   Parkinson disease (HCC) 04/03/2016   Right arm pain 06/03/2019   Sacroiliitis (HCC) 02/25/2018   Stroke (HCC) 11/02/2021   TIA   Tenosynovitis of right ankle 06/07/2016   Tibialis posterior tendinitis, left 06/02/2018   Discharge Diagnoses:   Principal Problem:   S/P reverse total shoulder arthroplasty, right  Estimated body mass index is 33.83 kg/m as calculated from the following:   Height as of this encounter: 5\' 7"  (1.702 m).   Weight as of this encounter: 98 kg.  Procedure:  Procedure(s) (LRB): REVERSE SHOULDER ARTHROPLASTY (Right)   Consults:  Hospitalist medicine consult  HPI: [name]. is a 76 year old  male who was evaluated at our office at [facility] and noted to have rotator cuff tear that was not amenable to repair in  addition to degeneration of the glenohumeral joint.  He failed conservative treatment and was indicated for reverse total shoulder arthroplasty.  Patient underwent surgery on 12/06/2021 and was admitted for postoperative monitoring.  Laboratory Data: Admission on 12/06/2021  Component Date Value Ref Range Status   Glucose-Capillary 12/06/2021 116 (H)  70 - 99 mg/dL Final   Glucose reference range applies only to samples taken after fasting for at least 8 hours.   Glucose-Capillary 12/06/2021 94  70 - 99 mg/dL Final   Glucose reference range applies only to samples taken after fasting for at least 8 hours.   Glucose-Capillary 12/06/2021 87  70 - 99 mg/dL Final   Glucose reference range applies only to samples taken after fasting for at least 8 hours.   Comment 1 12/06/2021 Notify RN   Final   Glucose-Capillary 12/06/2021 115 (H)  70 - 99 mg/dL Final   Glucose reference range applies only to samples taken after fasting for at least 8 hours.   Hemoglobin 12/07/2021 14.5  13.0 - 17.0 g/dL Final   HCT 34/74/2595 41.6  39.0 - 52.0 % Final   Performed at [facility], [address], [place], [place] [address]   Sodium 12/07/2021 128 (L)  135 - 145 mmol/L Final   Potassium 12/07/2021 5.2 (H)  3.5 - 5.1 mmol/L Final   Chloride 12/07/2021 99  98 - 111 mmol/L Final   CO2 12/07/2021 19 (L)  22 - 32 mmol/L Final   Glucose, Bld 12/07/2021 234 (H)  70 - 99 mg/dL Final   Glucose reference range applies only to samples taken after fasting for at least 8 hours.   BUN 12/07/2021 29 (H)  8 - 23 mg/dL Final   Creatinine, Ser 12/07/2021 1.10  0.61 - 1.24 mg/dL Final   Calcium 64/33/2951 8.4 (L)  8.9 - 10.3 mg/dL Final   GFR, Estimated 12/07/2021 >60  >60 mL/min Final   Comment: (NOTE) Calculated using the CKD-EPI Creatinine Equation (2021)    Anion gap 12/07/2021 10  5 - 15 Final   Performed at [facility], [address], [place], [place] [address]   Sodium 12/07/2021 135  135 - 145 mmol/L  Final   DELTA CHECK NOTED   Potassium 12/07/2021 4.1  3.5 - 5.1 mmol/L Final   DELTA CHECK NOTED   Chloride 12/07/2021 100  98 - 111 mmol/L Final   CO2 12/07/2021 27  22 - 32 mmol/L Final   Glucose, Bld 12/07/2021 142 (H)  70 - 99 mg/dL Final   Glucose reference range applies only to samples taken after fasting for at least 8 hours.   BUN 12/07/2021 27 (H)  8 - 23 mg/dL Final   Creatinine, Ser 12/07/2021 1.24  0.61 - 1.24 mg/dL Final   Calcium 60/63/0160 8.7 (L)  8.9 - 10.3 mg/dL Final   GFR, Estimated 12/07/2021 >60  >60 mL/min Final   Comment: (NOTE) Calculated using the CKD-EPI Creatinine Equation (2021)    Anion gap 12/07/2021 8  5 - 15 Final   Performed at [facility], [address], [place], [place] [address]   Troponin I (High Sensitivity) 12/07/2021 9  <18 ng/L Final   Comment: (NOTE) Elevated high sensitivity troponin I (hsTnI) values and significant  changes across serial measurements may suggest ACS but many other  chronic and acute  conditions are known to elevate hsTnI results.  Refer to the "Links" section for chest pain algorithms and additional  guidance. Performed at [facility], [address], [place], [place] [address]   Hospital Outpatient Visit on 11/23/2021  Component Date Value Ref Range Status   Hgb A1c MFr Bld 11/23/2021 6.0 (H)  4.8 - 5.6 % Final   Comment: (NOTE) Pre diabetes:          5.7%-6.4%  Diabetes:              >6.4%  Glycemic control for   <7.0% adults with diabetes    Mean Plasma Glucose 11/23/2021 125.5  mg/dL Final   Performed at [facility], [address] [place], [place] [address]   Sodium 11/23/2021 137  135 - 145 mmol/L Final   Potassium 11/23/2021 4.4  3.5 - 5.1 mmol/L Final   Chloride 11/23/2021 106  98 - 111 mmol/L Final   CO2 11/23/2021 25  22 - 32 mmol/L Final   Glucose, Bld 11/23/2021 117 (H)  70 - 99 mg/dL Final   Glucose reference range applies only to samples taken after fasting for at least 8  hours.   BUN 11/23/2021 29 (H)  8 - 23 mg/dL Final   Creatinine, Ser 11/23/2021 1.25 (H)  0.61 - 1.24 mg/dL Final   Calcium 29/56/2130 8.8 (L)  8.9 - 10.3 mg/dL Final   GFR, Estimated 11/23/2021 60 (L)  >60 mL/min Final   Comment: (NOTE) Calculated using the CKD-EPI Creatinine Equation (2021)    Anion gap 11/23/2021 6  5 - 15 Final   Performed at [facility], [address] [place], [place] [address]   WBC 11/23/2021 7.3  4.0 - 10.5 K/uL Final   RBC 11/23/2021 4.60  4.22 - 5.81 MIL/uL Final   Hemoglobin 11/23/2021 14.7  13.0 - 17.0 g/dL Final   HCT 46/96/2952 44.1  39.0 - 52.0 % Final   MCV 11/23/2021 95.9  80.0 - 100.0 fL Final   MCH 11/23/2021 32.0  26.0 - 34.0 pg Final   MCHC 11/23/2021 33.3  30.0 - 36.0 g/dL Final   RDW 84/13/2440 13.1  11.5 - 15.5 % Final   Platelets 11/23/2021 226  150 - 400 K/uL Final   nRBC 11/23/2021 0.0  0.0 - 0.2 % Final   Performed at [facility], [address] [place], [place] [address]   MRSA, PCR 11/23/2021 NEGATIVE  NEGATIVE Final   Staphylococcus aureus 11/23/2021 NEGATIVE  NEGATIVE Final   Comment: (NOTE) The Xpert SA Assay (FDA approved for NASAL specimens in patients 10 years of age and older), is one component of a comprehensive surveillance program. It is not intended to diagnose infection nor to guide or monitor treatment. Performed at [facility], [address], [place], [place] [address]    Glucose-Capillary 11/23/2021 116 (H)  70 - 99 mg/dL Final   Glucose reference range applies only to samples taken after fasting for at least 8 hours.     X-Rays:DG Shoulder Right Port  Result Date: 12/06/2021 CLINICAL DATA:  Post RIGHT shoulder arthroplasty. EXAM: RIGHT SHOULDER - 1 VIEW COMPARISON:  Sep 07, 2021 FINDINGS: Single AP portable radiograph of the shoulder shows changes of reverse shoulder arthroplasty on the RIGHT. No unexpected findings on this AP projection. Degenerative  changes are noted about the acromioclavicular joint. IMPRESSION: Reverse shoulder arthroplasty on the RIGHT. No unexpected findings on AP view. Electronically Signed  By: Donzetta Kohut M.D.   On: 12/06/2021 14:23    EKG: Orders placed or performed during the hospital encounter of 11/23/21   EKG 12 lead per protocol   EKG 12 lead per protocol     Hospital Course: Keny Donald. is a 76 y.o. who was admitted to Hospital. They were brought to the operating room on 12/06/2021 and underwent Procedure(s): REVERSE SHOULDER ARTHROPLASTY.  Patient tolerated the procedure well and was later transferred to the recovery room and then to the orthopaedic floor for postoperative care.  They were given PO and IV analgesics for pain control following their surgery.  They were given 24 hours of postoperative antibiotics of  Anti-infectives (From admission, onward)    Start     Dose/Rate Route Frequency Ordered Stop   12/06/21 1400  ceFAZolin (ANCEF) IVPB 1 g/50 mL premix        1 g 100 mL/hr over 30 Minutes Intravenous Every 6 hours 12/06/21 1358 12/07/21 0246   12/06/21 1240  vancomycin (VANCOCIN) powder  Status:  Discontinued          As needed 12/06/21 1240 12/06/21 1350   12/06/21 1000  ceFAZolin (ANCEF) IVPB 2g/100 mL premix  Status:  Discontinued        2 g 200 mL/hr over 30 Minutes Intravenous On call to O.R. 12/06/21 6503 12/06/21 1507   12/06/21 0954  ceFAZolin (ANCEF) 2-4 GM/100ML-% IVPB       Note to Pharmacy: Guadalupe Maple C: cabinet override      12/06/21 0954 12/06/21 1231      and started on DVT prophylaxis in the form of  Pradaxa .    OT was ordered for total joint protocol.    Patient had an uneventful night on the evening of surgery.  They started to get up OOB with therapy on day one.  Patient was noted to have multiple abnormal labs and the hospital service was consulted for medical management.  ***.   Patient was seen in rounds and was ready to go home.   Diet: Regular  diet Activity:NWB Follow-up:in 2 weeks Disposition - Home Discharged Condition: good   Discharge Instructions     Call MD / Call 911   Complete by: As directed    If you experience chest pain or shortness of breath, CALL 911 and be transported to the hospital emergency room.  If you develope a fever above 101 F, pus (white drainage) or increased drainage or redness at the wound, or calf pain, call your surgeon's office.   Constipation Prevention   Complete by: As directed    Drink plenty of fluids.  Prune juice may be helpful.  You may use a stool softener, such as Colace (over the counter) 100 mg twice a day.  Use MiraLax (over the counter) for constipation as needed.   Diet - low sodium heart healthy   Complete by: As directed    Increase activity slowly as tolerated   Complete by: As directed    Post-operative opioid taper instructions:   Complete by: As directed    POST-OPERATIVE OPIOID TAPER INSTRUCTIONS: It is important to wean off of your opioid medication as soon as possible. If you do not need pain medication after your surgery it is ok to stop day one. Opioids include: Codeine, Hydrocodone(Norco, Vicodin), Oxycodone(Percocet, oxycontin) and hydromorphone amongst others.  Long term and even short term use of opiods can cause: Increased pain response Dependence Constipation Depression Respiratory depression And  more.  Withdrawal symptoms can include Flu like symptoms Nausea, vomiting And more Techniques to manage these symptoms Hydrate well Eat regular healthy meals Stay active Use relaxation techniques(deep breathing, meditating, yoga) Do Not substitute Alcohol to help with tapering If you have been on opioids for less than two weeks and do not have pain than it is ok to stop all together.  Plan to wean off of opioids This plan should start within one week post op of your joint replacement. Maintain the same interval or time between taking each dose and first  decrease the dose.  Cut the total daily intake of opioids by one tablet each day Next start to increase the time between doses. The last dose that should be eliminated is the evening dose.         Allergies as of 12/07/2021       Reactions   Lisinopril Cough        Medication List     STOP taking these medications    HYDROcodone-acetaminophen 5-325 MG tablet Commonly known as: NORCO/VICODIN   oxycodone 5 MG capsule Commonly known as: OXY-IR Replaced by: oxyCODONE 5 MG immediate release tablet       TAKE these medications    acetaminophen 500 MG tablet Commonly known as: TYLENOL Take 1,000 mg by mouth every 6 (six) hours as needed for moderate pain.   ALPRAZolam 1 MG tablet Commonly known as: XANAX Take 0.5 mg by mouth 3 (three) times daily as needed for anxiety.   amLODipine 5 MG tablet Commonly known as: NORVASC Take 1 tablet (5 mg total) by mouth daily. What changed: when to take this   aspirin EC 325 MG tablet Take 325 mg by mouth daily.   atorvastatin 40 MG tablet Commonly known as: LIPITOR Take 40 mg by mouth daily.   atorvastatin 80 MG tablet Commonly known as: LIPITOR Take 1 tablet (80 mg total) by mouth daily.   buPROPion 300 MG 24 hr tablet Commonly known as: WELLBUTRIN XL Take 300 mg by mouth daily.   carbidopa-levodopa 25-100 MG tablet Commonly known as: SINEMET IR Take 2 tablets by mouth in the morning and at bedtime. As directed   clopidogrel 75 MG tablet Commonly known as: PLAVIX Take 1 tablet (75 mg total) by mouth daily with breakfast.   dabigatran 150 MG Caps capsule Commonly known as: PRADAXA Take 150 mg by mouth 2 (two) times daily.   ezetimibe 10 MG tablet Commonly known as: ZETIA Take 10 mg by mouth daily.   gabapentin 800 MG tablet Commonly known as: NEURONTIN Take 800-1,600 mg by mouth See admin instructions. Take 800 mg by mouth in the morning and 1600 mg at bedtime   hydrochlorothiazide 25 MG tablet Commonly  known as: HYDRODIURIL TAKE 1 TABLET EVERY DAY (NEED APPOINTMENT FOR FURTHER REFILLS)   irbesartan 300 MG tablet Commonly known as: AVAPRO Take 1 tablet (300 mg total) by mouth daily.   magnesium oxide 400 (240 Mg) MG tablet Commonly known as: MAG-OX Take 400 mg by mouth daily as needed (cramping).   meclizine 25 MG tablet Commonly known as: ANTIVERT Take 25 mg by mouth 3 (three) times daily as needed for dizziness.   methocarbamol 500 MG tablet Commonly known as: ROBAXIN Take 1 tablet (500 mg total) by mouth every 8 (eight) hours as needed for muscle spasms.   nitroGLYCERIN 0.4 MG SL tablet Commonly known as: NITROSTAT Place 1 tablet (0.4 mg total) under the tongue every 5 (five) minutes as needed  for chest pain.   ondansetron 4 MG tablet Commonly known as: Zofran Take 1 tablet (4 mg total) by mouth every 8 (eight) hours as needed for nausea or vomiting.   oxyCODONE 5 MG immediate release tablet Commonly known as: Roxicodone Take 1 tablet (5 mg total) by mouth every 6 (six) hours as needed for severe pain. Replaces: oxycodone 5 MG capsule   pantoprazole 40 MG tablet Commonly known as: PROTONIX Take 40 mg by mouth daily.   potassium chloride 10 MEQ tablet Commonly known as: KLOR-CON Take 10 mEq by mouth daily.   primidone 50 MG tablet Commonly known as: MYSOLINE Take 50 mg by mouth at bedtime as needed (For sleep).   Vitamin D (Ergocalciferol) 1.25 MG (50000 UNIT) Caps capsule Commonly known as: DRISDOL Take 50,000 Units by mouth every 7 (seven) days.         Signed: Dion Saucier PA-C  Orthopaedic Surgery 12/07/2021, 6:00 PM

## 2021-12-07 NOTE — Progress Notes (Signed)
Spoke with medicine. Repeat labs normalized. Will d/c tonight. Called wife who was in the room with patient and notified them.

## 2021-12-07 NOTE — Anesthesia Postprocedure Evaluation (Signed)
Anesthesia Post Note  Patient: [name].  Procedure(s) Performed: REVERSE SHOULDER ARTHROPLASTY (Right: Shoulder)     Patient location during evaluation: PACU Anesthesia Type: General Level of consciousness: awake and alert Pain management: pain level controlled Vital Signs Assessment: post-procedure vital signs reviewed and stable Respiratory status: spontaneous breathing, nonlabored ventilation and respiratory function stable Cardiovascular status: blood pressure returned to baseline and stable Postop Assessment: no apparent nausea or vomiting Anesthetic complications: no   No notable events documented.  Last Vitals:  Vitals:   12/06/21 2103 12/07/21 0324  BP: 122/77 103/75  Pulse: 75 (!) 55  Resp: 18 16  Temp: 36.4 C 36.4 C  SpO2: 94% 96%    Last Pain:  Vitals:   12/07/21 0324  TempSrc: Oral  PainSc:    Pain Goal:                   Lucretia Kern

## 2021-12-11 ENCOUNTER — Encounter (HOSPITAL_COMMUNITY): Payer: Self-pay | Admitting: Orthopedic Surgery

## 2023-01-13 ENCOUNTER — Emergency Department (HOSPITAL_BASED_OUTPATIENT_CLINIC_OR_DEPARTMENT_OTHER): Payer: Medicare HMO

## 2023-01-13 ENCOUNTER — Other Ambulatory Visit: Payer: Self-pay

## 2023-01-13 ENCOUNTER — Encounter (HOSPITAL_BASED_OUTPATIENT_CLINIC_OR_DEPARTMENT_OTHER): Payer: Self-pay | Admitting: Emergency Medicine

## 2023-01-13 ENCOUNTER — Emergency Department (HOSPITAL_BASED_OUTPATIENT_CLINIC_OR_DEPARTMENT_OTHER)
Admission: EM | Admit: 2023-01-13 | Discharge: 2023-01-13 | Disposition: A | Discharge status: Home / Self Care | Payer: Medicare HMO | Attending: Emergency Medicine | Admitting: Emergency Medicine

## 2023-01-13 DIAGNOSIS — Z7902 Long term (current) use of antithrombotics/antiplatelets: Secondary | ICD-10-CM | POA: Insufficient documentation

## 2023-01-13 DIAGNOSIS — M25512 Pain in left shoulder: Secondary | ICD-10-CM | POA: Diagnosis present

## 2023-01-13 DIAGNOSIS — Z7982 Long term (current) use of aspirin: Secondary | ICD-10-CM | POA: Diagnosis not present

## 2023-01-13 DIAGNOSIS — X500XXA Overexertion from strenuous movement or load, initial encounter: Secondary | ICD-10-CM | POA: Insufficient documentation

## 2023-01-13 DIAGNOSIS — Y93H2 Activity, gardening and landscaping: Secondary | ICD-10-CM | POA: Diagnosis not present

## 2023-01-13 DIAGNOSIS — G8911 Acute pain due to trauma: Secondary | ICD-10-CM | POA: Diagnosis not present

## 2023-01-13 MED ORDER — KETOROLAC TROMETHAMINE 15 MG/ML IJ SOLN
15.0000 mg | Freq: Once | INTRAMUSCULAR | Status: AC
Start: 2023-01-13 — End: 2023-01-13
  Administered 2023-01-13: 15 mg via INTRAMUSCULAR
  Filled 2023-01-13: qty 1

## 2023-01-13 MED ORDER — MORPHINE SULFATE 15 MG PO TABS: 7.5000 mg | ORAL_TABLET | ORAL | 0 refills | Status: AC | PRN

## 2023-01-13 MED ORDER — DICLOFENAC SODIUM 1 % EX GEL: 4.0000 g | Freq: Four times a day (QID) | CUTANEOUS | 0 refills | Status: AC

## 2023-01-13 NOTE — ED Triage Notes (Signed)
Pt to ER with c/o left bicep pain that started a week ago after lifting heavy bags.  Pt states that he saw his PCP who told him that he tore a muscle and to give it time.  PT states he has been taking Tylenol for the pain with no relief.  States he feels like the pain is getting worse.

## 2023-01-13 NOTE — Discharge Instructions (Addendum)
Use the gel as prescribed. Also take tylenol 1000mg (2 extra strength) four times a day.    Then take the pain medicine if you feel like you need it. Narcotics do not help with the pain, they only make you care about it less.  You can become addicted to this, people may break into your house to steal it.  It will constipate you.  If you drive under the influence of this medicine you can get a DUI.    The sling is for comfort only.  Please take it off at least 4 times a day and perform range of motion exercises.

## 2023-01-13 NOTE — ED Provider Notes (Signed)
[place] [facility] AT [facility] Provider Note   CSN: [id number] Arrival date & time: 01/13/23  1021     History  Chief Complaint  Patient presents with   Arm Injury    Judy Germer. is a 77 y.o. male.  77 yo M with a chief complaints of left arm pain.  He said it started about a week ago he was lifting some bags of dirt to do some gardening and felt some acute pain.  Went saw his doctor who told him that he perhaps tore his bicep and that should get better on its own.  Patient has been taking Tylenol and has had worsening of his discomfort.  Tells me that it is mostly on the anterior aspect of the humerus about two thirds of the way up.  Denies any obvious trauma.   Arm Injury      Home Medications Prior to Admission medications   Medication Sig Start Date End Date Taking? Authorizing Provider  diclofenac Sodium (VOLTAREN) 1 % GEL Apply 4 g topically 4 (four) times daily. 01/13/23  Yes Melene Plan, DO  morphine (MSIR) 15 MG tablet Take 0.5 tablets (7.5 mg total) by mouth every 4 (four) hours as needed for severe pain. 01/13/23  Yes Melene Plan, DO  acetaminophen (TYLENOL) 500 MG tablet Take 1,000 mg by mouth every 6 (six) hours as needed for moderate pain.    [provider]  ALPRAZolam Prudy Feeler) 1 MG tablet Take 0.5 mg by mouth 3 (three) times daily as needed for anxiety.     [provider]  amLODipine (NORVASC) 5 MG tablet Take 1 tablet (5 mg total) by mouth daily. Patient taking differently: Take 5 mg by mouth at bedtime. 10/06/19   Jodelle Gross, NP  aspirin EC 325 MG tablet Take 325 mg by mouth daily.    [provider]  atorvastatin (LIPITOR) 40 MG tablet Take 40 mg by mouth daily.    [provider]  atorvastatin (LIPITOR) 80 MG tablet Take 1 tablet (80 mg total) by mouth daily. Patient not taking: Reported on 11/20/2021 09/28/19   Furth, Cadence H, PA-C  buPROPion (WELLBUTRIN XL) 300 MG 24 hr tablet Take 300  mg by mouth daily.    [provider]  carbidopa-levodopa (SINEMET IR) 25-100 MG tablet Take 2 tablets by mouth in the morning and at bedtime. As directed 11/05/19   [provider]  clopidogrel (PLAVIX) 75 MG tablet Take 1 tablet (75 mg total) by mouth daily with breakfast. Patient not taking: Reported on 11/20/2021 02/15/20   Tobb, Kardie, DO  dabigatran (PRADAXA) 150 MG CAPS capsule Take 150 mg by mouth 2 (two) times daily.    [provider]  ezetimibe (ZETIA) 10 MG tablet Take 10 mg by mouth daily.    [provider]  gabapentin (NEURONTIN) 800 MG tablet Take 800-1,600 mg by mouth See admin instructions. Take 800 mg by mouth in the morning and 1600 mg at bedtime    [provider]  hydrochlorothiazide (HYDRODIURIL) 25 MG tablet TAKE 1 TABLET EVERY DAY (NEED APPOINTMENT FOR FURTHER REFILLS) 03/19/21   Tobb, Kardie, DO  irbesartan (AVAPRO) 300 MG tablet Take 1 tablet (300 mg total) by mouth daily. 09/28/19   Furth, Cadence H, PA-C  magnesium oxide (MAG-OX) 400 (240 Mg) MG tablet Take 400 mg by mouth daily as needed (cramping).    [provider]  meclizine (ANTIVERT) 25 MG tablet Take 25 mg by mouth  3 (three) times daily as needed for dizziness.    [provider]  methocarbamol (ROBAXIN) 500 MG tablet Take 1 tablet (500 mg total) by mouth every 8 (eight) hours as needed for muscle spasms. 12/07/21   McClung, Fuller Plan D, PA  nitroGLYCERIN (NITROSTAT) 0.4 MG SL tablet Place 1 tablet (0.4 mg total) under the tongue every 5 (five) minutes as needed for chest pain. 10/06/19 11/20/21  Jodelle Gross, NP  oxyCODONE (ROXICODONE) 5 MG immediate release tablet Take 1 tablet (5 mg total) by mouth every 6 (six) hours as needed for severe pain. 12/07/21   McClung, Fuller Plan D, PA  pantoprazole (PROTONIX) 40 MG tablet Take 40 mg by mouth daily.    [provider]  potassium chloride (KLOR-CON) 10 MEQ tablet Take 10 mEq by mouth daily. 10/10/21    [provider]  primidone (MYSOLINE) 50 MG tablet Take 50 mg by mouth at bedtime as needed (For sleep).    [provider]  Vitamin D, Ergocalciferol, (DRISDOL) 1.25 MG (50000 UNIT) CAPS capsule Take 50,000 Units by mouth every 7 (seven) days.    [provider]      Allergies    Lisinopril    Review of Systems   Review of Systems  Physical Exam Updated Vital Signs BP (!) 142/97   Pulse (!) 59   Temp 97.7 F (36.5 C)   Resp 18   Ht 5' 6.5" (1.689 m)   Wt 95.7 kg   SpO2 97%   BMI 33.55 kg/m  Physical Exam Vitals and nursing note reviewed.  Constitutional:      Appearance: He is well-developed.  HENT:     Head: Normocephalic and atraumatic.  Eyes:     Pupils: Pupils are equal, round, and reactive to light.  Neck:     Vascular: No JVD.  Cardiovascular:     Rate and Rhythm: Normal rate and regular rhythm.     Heart sounds: No murmur heard.    No friction rub. No gallop.  Pulmonary:     Effort: No respiratory distress.     Breath sounds: No wheezing.  Abdominal:     General: There is no distension.     Tenderness: There is no abdominal tenderness. There is no guarding or rebound.  Musculoskeletal:        General: Normal range of motion.     Cervical back: Normal range of motion and neck supple.     Comments: Patient is very uncomfortable about the [facility] joints and about the biceps groove.  He does have some pain along the biceps muscle itself.  I do not appreciate any obvious bulging or deformity to the biceps.  It is a difficult exam due to severe discomfort with all directions of range of motion.  Pulse motor and sensation are intact distally.  He has no pain to the forearm or elbow.  Skin:    Coloration: Skin is not pale.     Findings: No rash.  Neurological:     Mental Status: He is alert and oriented to person, place, and time.  Psychiatric:        Behavior: Behavior normal.     ED Results / Procedures / Treatments   Labs (all labs  ordered are listed, but only abnormal results are displayed) Labs Reviewed - No data to display  EKG None  Radiology No results found.  Procedures Procedures    Medications Ordered in ED Medications  ketorolac (TORADOL) 15 MG/ML injection 15  mg (15 mg Intramuscular Given 01/13/23 1058)    ED Course/ Medical Decision Making/ A&P                                 Medical Decision Making Amount and/or Complexity of Data Reviewed Radiology: ordered.  Risk Prescription drug management.   77 yo M with a chief complaints of left shoulder pain.  Going on for about a week.  He has severe pain all about the left shoulder.  Tells me its worst about the upper portion of the biceps.  Will obtain a plain film here.  Sling for comfort.  Give orthopedic follow-up.  Plain film of the left shoulder independently interpreted by me without fx or dislocation.  D/c home.   12:19 PM:  I have discussed the diagnosis/risks/treatment options with the patient.  Evaluation and diagnostic testing in the emergency department does not suggest an emergent condition requiring admission or immediate intervention beyond what has been performed at this time.  They will follow up with Ortho. We also discussed returning to the ED immediately if new or worsening sx occur. We discussed the sx which are most concerning (e.g., sudden worsening pain, fever, inability to tolerate by mouth) that necessitate immediate return. Medications administered to the patient during their visit and any new prescriptions provided to the patient are listed below.  Medications given during this visit Medications  ketorolac (TORADOL) 15 MG/ML injection 15 mg (15 mg Intramuscular Given 01/13/23 1058)     The patient appears reasonably screen and/or stabilized for discharge and I doubt any other medical condition or other [facility] requiring further screening, evaluation, or treatment in the ED at this time prior to discharge.           Final Clinical Impression(s) / ED Diagnoses Final diagnoses:  Acute pain of left shoulder    Rx / DC Orders ED Discharge Orders          Ordered    diclofenac Sodium (VOLTAREN) 1 % GEL  4 times daily        01/13/23 1100    morphine (MSIR) 15 MG tablet  Every 4 hours PRN        01/13/23 1100              [place], DO 01/13/23 1219

## 2023-01-13 NOTE — ED Notes (Signed)
Ice Pack for shoulder
